# Patient Record
Sex: Male | Born: 1955 | Race: White | Hispanic: No | Marital: Married | Smoking: Former smoker
Health system: Southern US, Community
[De-identification: ages and names within clinical notes are randomized; demographics above are authoritative.]

## PROBLEM LIST (undated history)

## (undated) DIAGNOSIS — B9562 Methicillin resistant Staphylococcus aureus infection as the cause of diseases classified elsewhere: Secondary | ICD-10-CM

## (undated) DIAGNOSIS — Z9289 Personal history of other medical treatment: Secondary | ICD-10-CM

## (undated) DIAGNOSIS — I10 Essential (primary) hypertension: Secondary | ICD-10-CM

## (undated) DIAGNOSIS — N529 Male erectile dysfunction, unspecified: Secondary | ICD-10-CM

## (undated) DIAGNOSIS — G4730 Sleep apnea, unspecified: Secondary | ICD-10-CM

## (undated) DIAGNOSIS — M542 Cervicalgia: Secondary | ICD-10-CM

## (undated) DIAGNOSIS — E7800 Pure hypercholesterolemia, unspecified: Secondary | ICD-10-CM

## (undated) DIAGNOSIS — R7881 Bacteremia: Secondary | ICD-10-CM

## (undated) DIAGNOSIS — M1990 Unspecified osteoarthritis, unspecified site: Secondary | ICD-10-CM

## (undated) HISTORY — PX: WISDOM TOOTH EXTRACTION: SHX21

## (undated) HISTORY — DX: Pure hypercholesterolemia, unspecified: E78.00

## (undated) HISTORY — DX: Unspecified osteoarthritis, unspecified site: M19.90

## (undated) HISTORY — DX: Cervicalgia: M54.2

## (undated) HISTORY — DX: Male erectile dysfunction, unspecified: N52.9

## (undated) HISTORY — DX: Sleep apnea, unspecified: G47.30

## (undated) HISTORY — DX: Methicillin resistant Staphylococcus aureus infection as the cause of diseases classified elsewhere: B95.62

## (undated) HISTORY — DX: Bacteremia: R78.81

## (undated) HISTORY — DX: Personal history of other medical treatment: Z92.89

## (undated) HISTORY — PX: TONSILLECTOMY: SUR1361

## (undated) HISTORY — DX: Essential (primary) hypertension: I10

---
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
## 1998-09-06 ENCOUNTER — Encounter: Payer: Self-pay | Admitting: Family Medicine

## 1998-09-06 LAB — HM SIGMOIDOSCOPY

## 2004-07-11 ENCOUNTER — Ambulatory Visit: Payer: Self-pay | Admitting: Family Medicine

## 2004-12-26 ENCOUNTER — Ambulatory Visit: Payer: Self-pay | Admitting: Family Medicine

## 2005-01-02 ENCOUNTER — Ambulatory Visit: Payer: Self-pay | Admitting: Family Medicine

## 2005-01-19 ENCOUNTER — Ambulatory Visit: Payer: Self-pay | Admitting: Family Medicine

## 2005-04-17 ENCOUNTER — Encounter: Payer: Self-pay | Admitting: Pulmonary Disease

## 2005-04-17 ENCOUNTER — Encounter: Payer: Self-pay | Admitting: Family Medicine

## 2005-04-17 DIAGNOSIS — Z9289 Personal history of other medical treatment: Secondary | ICD-10-CM

## 2005-04-17 DIAGNOSIS — G4730 Sleep apnea, unspecified: Secondary | ICD-10-CM

## 2005-04-17 HISTORY — DX: Sleep apnea, unspecified: G47.30

## 2005-04-17 HISTORY — DX: Personal history of other medical treatment: Z92.89

## 2005-04-17 LAB — HM COLONOSCOPY

## 2005-05-22 ENCOUNTER — Ambulatory Visit: Payer: Self-pay | Admitting: Family Medicine

## 2005-06-30 ENCOUNTER — Ambulatory Visit: Payer: Self-pay | Admitting: Family Medicine

## 2006-06-23 ENCOUNTER — Ambulatory Visit: Payer: Self-pay | Admitting: Family Medicine

## 2006-11-15 ENCOUNTER — Ambulatory Visit: Payer: Self-pay | Admitting: Family Medicine

## 2006-12-30 DIAGNOSIS — G4733 Obstructive sleep apnea (adult) (pediatric): Secondary | ICD-10-CM | POA: Insufficient documentation

## 2007-01-11 ENCOUNTER — Ambulatory Visit: Payer: Self-pay | Admitting: Family Medicine

## 2007-01-12 ENCOUNTER — Encounter: Payer: Self-pay | Admitting: Family Medicine

## 2007-01-13 ENCOUNTER — Ambulatory Visit: Payer: Self-pay | Admitting: Family Medicine

## 2007-02-15 ENCOUNTER — Telehealth (INDEPENDENT_AMBULATORY_CARE_PROVIDER_SITE_OTHER): Payer: Self-pay | Admitting: *Deleted

## 2007-03-25 ENCOUNTER — Ambulatory Visit: Payer: Self-pay | Admitting: Family Medicine

## 2007-05-19 ENCOUNTER — Ambulatory Visit: Payer: Self-pay | Admitting: Family Medicine

## 2007-06-28 ENCOUNTER — Ambulatory Visit: Payer: Self-pay | Admitting: Family Medicine

## 2007-09-01 ENCOUNTER — Ambulatory Visit: Payer: Self-pay | Admitting: Family Medicine

## 2007-09-02 ENCOUNTER — Telehealth: Payer: Self-pay | Admitting: Family Medicine

## 2007-10-05 ENCOUNTER — Ambulatory Visit: Payer: Self-pay | Admitting: Family Medicine

## 2007-10-05 DIAGNOSIS — M62838 Other muscle spasm: Secondary | ICD-10-CM | POA: Insufficient documentation

## 2007-12-07 ENCOUNTER — Ambulatory Visit: Payer: Self-pay | Admitting: Family Medicine

## 2007-12-07 LAB — CONVERTED CEMR LAB
ALT: 23 units/L (ref 0–53)
AST: 21 units/L (ref 0–37)
Albumin: 3.8 g/dL (ref 3.5–5.2)
Alkaline Phosphatase: 69 units/L (ref 39–117)
BUN: 16 mg/dL (ref 6–23)
Basophils Absolute: 0.1 10*3/uL (ref 0.0–0.1)
Basophils Relative: 0.8 % (ref 0.0–3.0)
Bilirubin, Direct: 0.1 mg/dL (ref 0.0–0.3)
CO2: 30 meq/L (ref 19–32)
Calcium: 9.2 mg/dL (ref 8.4–10.5)
Chloride: 103 meq/L (ref 96–112)
Cholesterol: 196 mg/dL (ref 0–200)
Creatinine, Ser: 1.1 mg/dL (ref 0.4–1.5)
Direct LDL: 121.6 mg/dL
Eosinophils Absolute: 0.3 10*3/uL (ref 0.0–0.7)
Eosinophils Relative: 4.1 % (ref 0.0–5.0)
GFR calc Af Amer: 91 mL/min
GFR calc non Af Amer: 75 mL/min
Glucose, Bld: 92 mg/dL (ref 70–99)
HCT: 41.3 % (ref 39.0–52.0)
HDL: 29.9 mg/dL — ABNORMAL LOW (ref >39.0)
Hemoglobin: 14.6 g/dL (ref 13.0–17.0)
Lymphocytes Relative: 26.7 % (ref 12.0–46.0)
MCHC: 35.3 g/dL (ref 30.0–36.0)
MCV: 87.4 fL (ref 78.0–100.0)
Monocytes Absolute: 0.8 10*3/uL (ref 0.1–1.0)
Monocytes Relative: 11.9 % (ref 3.0–12.0)
Neutro Abs: 3.6 10*3/uL (ref 1.4–7.7)
Neutrophils Relative %: 56.5 % (ref 43.0–77.0)
PSA: 0.92 ng/mL (ref 0.10–4.00)
Platelets: 244 10*3/uL (ref 150–400)
Potassium: 4.5 meq/L (ref 3.5–5.1)
RBC: 4.73 M/uL (ref 4.22–5.81)
RDW: 12.6 % (ref 11.5–14.6)
Sodium: 139 meq/L (ref 135–145)
TSH: 1.32 microintl units/mL (ref 0.35–5.50)
Total Bilirubin: 0.6 mg/dL (ref 0.3–1.2)
Total CHOL/HDL Ratio: 6.6
Total Protein: 6.5 g/dL (ref 6.0–8.3)
Triglycerides: 233 mg/dL (ref 0–149)
VLDL: 47 mg/dL — ABNORMAL HIGH (ref 0–40)
WBC: 6.5 10*3/uL (ref 4.5–10.5)

## 2007-12-08 ENCOUNTER — Encounter (INDEPENDENT_AMBULATORY_CARE_PROVIDER_SITE_OTHER): Payer: Self-pay | Admitting: *Deleted

## 2007-12-22 ENCOUNTER — Encounter (INDEPENDENT_AMBULATORY_CARE_PROVIDER_SITE_OTHER): Payer: Self-pay | Admitting: *Deleted

## 2007-12-22 LAB — CONVERTED CEMR LAB
BUN: 13 mg/dL
Cholesterol: 259 mg/dL
Creatinine, Ser: 1 mg/dL
Glucose, Bld: 88 mg/dL
HDL: 47 mg/dL
LDL Cholesterol: 172 mg/dL
PSA: 2.11 ng/mL
Potassium: 5.5 meq/L
Triglycerides: 183 mg/dL

## 2008-02-29 ENCOUNTER — Ambulatory Visit: Payer: Self-pay | Admitting: Family Medicine

## 2008-02-29 DIAGNOSIS — D485 Neoplasm of uncertain behavior of skin: Secondary | ICD-10-CM | POA: Insufficient documentation

## 2008-03-22 ENCOUNTER — Telehealth: Payer: Self-pay | Admitting: Family Medicine

## 2008-05-01 ENCOUNTER — Ambulatory Visit: Payer: Self-pay | Admitting: Family Medicine

## 2008-05-18 ENCOUNTER — Ambulatory Visit: Payer: Self-pay | Admitting: Pulmonary Disease

## 2008-06-01 ENCOUNTER — Encounter (INDEPENDENT_AMBULATORY_CARE_PROVIDER_SITE_OTHER): Payer: Self-pay | Admitting: *Deleted

## 2008-06-01 ENCOUNTER — Ambulatory Visit: Payer: Self-pay | Admitting: Family Medicine

## 2008-06-01 LAB — CONVERTED CEMR LAB
OCCULT 1: NEGATIVE
OCCULT 2: NEGATIVE
OCCULT 3: NEGATIVE

## 2008-06-28 ENCOUNTER — Telehealth: Payer: Self-pay | Admitting: Family Medicine

## 2008-09-14 ENCOUNTER — Telehealth (INDEPENDENT_AMBULATORY_CARE_PROVIDER_SITE_OTHER): Payer: Self-pay | Admitting: Internal Medicine

## 2008-09-14 ENCOUNTER — Ambulatory Visit: Payer: Self-pay | Admitting: Family Medicine

## 2008-09-17 ENCOUNTER — Telehealth (INDEPENDENT_AMBULATORY_CARE_PROVIDER_SITE_OTHER): Payer: Self-pay | Admitting: *Deleted

## 2008-09-18 ENCOUNTER — Telehealth: Payer: Self-pay | Admitting: Family Medicine

## 2008-10-19 ENCOUNTER — Telehealth: Payer: Self-pay | Admitting: Family Medicine

## 2008-11-09 ENCOUNTER — Telehealth: Payer: Self-pay | Admitting: Family Medicine

## 2008-12-18 ENCOUNTER — Telehealth: Payer: Self-pay | Admitting: Family Medicine

## 2008-12-28 ENCOUNTER — Ambulatory Visit: Payer: Self-pay | Admitting: Family Medicine

## 2009-01-07 ENCOUNTER — Ambulatory Visit: Payer: Self-pay | Admitting: Family Medicine

## 2009-01-07 DIAGNOSIS — E669 Obesity, unspecified: Secondary | ICD-10-CM | POA: Insufficient documentation

## 2009-01-07 DIAGNOSIS — I10 Essential (primary) hypertension: Secondary | ICD-10-CM | POA: Insufficient documentation

## 2009-02-21 ENCOUNTER — Ambulatory Visit: Payer: Self-pay | Admitting: Family Medicine

## 2009-02-27 ENCOUNTER — Telehealth: Payer: Self-pay | Admitting: Family Medicine

## 2009-03-08 ENCOUNTER — Telehealth: Payer: Self-pay | Admitting: Family Medicine

## 2009-03-25 ENCOUNTER — Ambulatory Visit: Payer: Self-pay | Admitting: Family Medicine

## 2009-03-25 DIAGNOSIS — E559 Vitamin D deficiency, unspecified: Secondary | ICD-10-CM | POA: Insufficient documentation

## 2009-03-26 LAB — CONVERTED CEMR LAB: Vit D, 25-Hydroxy: 21 ng/mL — ABNORMAL LOW (ref 30–89)

## 2009-04-01 ENCOUNTER — Ambulatory Visit: Payer: Self-pay | Admitting: Family Medicine

## 2009-04-12 ENCOUNTER — Telehealth (INDEPENDENT_AMBULATORY_CARE_PROVIDER_SITE_OTHER): Payer: Self-pay | Admitting: Internal Medicine

## 2009-05-23 ENCOUNTER — Telehealth: Payer: Self-pay | Admitting: Family Medicine

## 2009-06-27 ENCOUNTER — Ambulatory Visit: Payer: Self-pay | Admitting: Family Medicine

## 2009-06-27 LAB — CONVERTED CEMR LAB
ALT: 28 units/L (ref 0–53)
AST: 23 units/L (ref 0–37)
Albumin: 4.1 g/dL (ref 3.5–5.2)
Alkaline Phosphatase: 72 units/L (ref 39–117)
BUN: 17 mg/dL (ref 6–23)
Basophils Absolute: 0 10*3/uL (ref 0.0–0.1)
Basophils Relative: 0 % (ref 0.0–3.0)
Bilirubin, Direct: 0.1 mg/dL (ref 0.0–0.3)
CO2: 32 meq/L (ref 19–32)
Calcium: 9.2 mg/dL (ref 8.4–10.5)
Chloride: 106 meq/L (ref 96–112)
Cholesterol: 185 mg/dL (ref 0–200)
Creatinine, Ser: 1 mg/dL (ref 0.4–1.5)
Eosinophils Absolute: 0.2 10*3/uL (ref 0.0–0.7)
Eosinophils Relative: 3.6 % (ref 0.0–5.0)
GFR calc non Af Amer: 83.31 mL/min (ref >60)
Glucose, Bld: 88 mg/dL (ref 70–99)
HCT: 42.6 % (ref 39.0–52.0)
HDL: 36.4 mg/dL — ABNORMAL LOW (ref >39.00)
Hemoglobin: 14 g/dL (ref 13.0–17.0)
LDL Cholesterol: 109 mg/dL — ABNORMAL HIGH (ref 0–99)
Lymphocytes Relative: 25.9 % (ref 12.0–46.0)
Lymphs Abs: 1.5 10*3/uL (ref 0.7–4.0)
MCHC: 32.9 g/dL (ref 30.0–36.0)
MCV: 87.6 fL (ref 78.0–100.0)
Monocytes Absolute: 0.6 10*3/uL (ref 0.1–1.0)
Monocytes Relative: 10.5 % (ref 3.0–12.0)
Neutro Abs: 3.6 10*3/uL (ref 1.4–7.7)
Neutrophils Relative %: 60 % (ref 43.0–77.0)
PSA: 0.77 ng/mL (ref 0.10–4.00)
Platelets: 198 10*3/uL (ref 150.0–400.0)
Potassium: 4.7 meq/L (ref 3.5–5.1)
RBC: 4.86 M/uL (ref 4.22–5.81)
RDW: 12.7 % (ref 11.5–14.6)
Sodium: 142 meq/L (ref 135–145)
TSH: 1.41 microintl units/mL (ref 0.35–5.50)
Total Bilirubin: 0.4 mg/dL (ref 0.3–1.2)
Total CHOL/HDL Ratio: 5
Total Protein: 7.1 g/dL (ref 6.0–8.3)
Triglycerides: 196 mg/dL — ABNORMAL HIGH (ref 0.0–149.0)
VLDL: 39.2 mg/dL (ref 0.0–40.0)
WBC: 5.9 10*3/uL (ref 4.5–10.5)

## 2009-06-30 LAB — CONVERTED CEMR LAB: Vit D, 25-Hydroxy: 34 ng/mL (ref 30–89)

## 2009-07-02 ENCOUNTER — Ambulatory Visit: Payer: Self-pay | Admitting: Family Medicine

## 2009-07-05 ENCOUNTER — Telehealth: Payer: Self-pay | Admitting: Family Medicine

## 2009-07-12 ENCOUNTER — Ambulatory Visit: Payer: Self-pay | Admitting: Family Medicine

## 2009-07-12 LAB — FECAL OCCULT BLOOD, GUAIAC: Fecal Occult Blood: NEGATIVE

## 2009-07-12 LAB — CONVERTED CEMR LAB
OCCULT 1: NEGATIVE
OCCULT 2: NEGATIVE
OCCULT 3: NEGATIVE

## 2009-07-15 ENCOUNTER — Encounter (INDEPENDENT_AMBULATORY_CARE_PROVIDER_SITE_OTHER): Payer: Self-pay | Admitting: *Deleted

## 2009-08-15 ENCOUNTER — Telehealth: Payer: Self-pay | Admitting: Family Medicine

## 2009-09-12 ENCOUNTER — Ambulatory Visit: Payer: Self-pay | Admitting: Family Medicine

## 2009-09-18 ENCOUNTER — Telehealth: Payer: Self-pay | Admitting: Family Medicine

## 2009-10-01 ENCOUNTER — Ambulatory Visit: Payer: Self-pay | Admitting: Family Medicine

## 2009-12-13 ENCOUNTER — Encounter (INDEPENDENT_AMBULATORY_CARE_PROVIDER_SITE_OTHER): Payer: Self-pay | Admitting: *Deleted

## 2009-12-17 ENCOUNTER — Encounter (INDEPENDENT_AMBULATORY_CARE_PROVIDER_SITE_OTHER): Payer: Self-pay | Admitting: *Deleted

## 2010-01-27 ENCOUNTER — Telehealth: Payer: Self-pay | Admitting: Family Medicine

## 2010-02-10 ENCOUNTER — Telehealth: Payer: Self-pay | Admitting: Family Medicine

## 2010-02-10 ENCOUNTER — Telehealth (INDEPENDENT_AMBULATORY_CARE_PROVIDER_SITE_OTHER): Payer: Self-pay | Admitting: *Deleted

## 2010-02-13 ENCOUNTER — Ambulatory Visit: Payer: Self-pay | Admitting: Pulmonary Disease

## 2010-02-19 ENCOUNTER — Ambulatory Visit: Payer: Self-pay | Admitting: Family Medicine

## 2010-03-25 ENCOUNTER — Telehealth: Payer: Self-pay | Admitting: Family Medicine

## 2010-04-01 ENCOUNTER — Ambulatory Visit: Payer: Self-pay | Admitting: Family Medicine

## 2010-04-02 ENCOUNTER — Telehealth: Payer: Self-pay | Admitting: Family Medicine

## 2010-05-07 ENCOUNTER — Telehealth: Payer: Self-pay | Admitting: Family Medicine

## 2010-06-10 NOTE — Assessment & Plan Note (Signed)
Summary: 4:15 schaller flu shot/rbh R/S FROM 10/11  Nurse Visit   Allergies: 1)  ! * Codeine  Orders Added: 1)  Admin 1st Vaccine [90471] 2)  Flu Vaccine 52yrs + [41324]  Flu Vaccine Consent Questions     Do you have a history of severe allergic reactions to this vaccine? no    Any prior history of allergic reactions to egg and/or gelatin? no    Do you have a sensitivity to the preservative Thimersol? no    Do you have a past history of Guillan-Barre Syndrome? no    Do you currently have an acute febrile illness? no    Have you ever had a severe reaction to latex? no    Vaccine information given and explained to patient? yes    Are you currently pregnant? no    Lot Number:[id number]   Exp Date:11/08/2010   Site Given  Left Deltoid IM

## 2010-06-10 NOTE — Progress Notes (Signed)
Summary: refil vicodine  Phone Note Refill Request Message from:  [facility]line on Sep 17, 2008 12:32 PM  Not on med list. In Dr. Lorenza Chick absence. Vicodin 5/500mg  take one tablet by mouth every 6 hours as needed [address] [facility] [address] HY#[phone].  Initial call taken by: Silas Sacramento CMA,  Sep 17, 2008 12:34 PM  Follow-up for Phone Call        see  phone message of Friday  Billie-Lynn Tyler Deis FNP  Sep 17, 2008 2:12 PM   Additional Follow-up for Phone Call Additional follow up Details #1::        Patient is almost out of Hycodan and would like the Vicodin 5-500mg  tabs called in to [facility] [address] He is feeling some better but is still coughing. Additional Follow-up by: Lewanda Rife,  Sep 17, 2008 4:49 PM    Additional Follow-up for Phone Call Additional follow up Details #2::    will refill caps, Rx attatched Billie-Lynn Tyler Deis FNP  Sep 17, 2008 5:01 PM n  Additional Follow-up for Phone Call Additional follow up Details #3:: Details for Additional Follow-up Action Taken: Medication phoned to pharmacy.  Additional Follow-up by: Lewanda Rife,  Sep 17, 2008 5:10 PM  New/Updated Medications: VICODIN 5-500 MG TABS (HYDROCODONE-ACETAMINOPHEN) 1 every 6 hrs as needed pain   Prescriptions: VICODIN 5-500 MG TABS (HYDROCODONE-ACETAMINOPHEN) 1 every 6 hrs as needed pain  #40 x 0   Entered and Authorized by:   Gildardo Griffes FNP   Signed by:   Lewanda Rife on 09/17/2008   Method used:   Telephoned to ...       [facility] [address] (retail)       [address]       [place], [place]  [address]       Ph: [phone]       Fax: [phone]   RxID:   [id number]

## 2010-06-10 NOTE — Assessment & Plan Note (Signed)
Summary: 2  1/2 MONTH FOLLOW UP/RBH   Vital Signs:  Patient profile:   55 year old male Height:      69.25 inches Weight:      287.25 pounds BMI:     42.27 Temp:     98.5 degrees F oral Pulse rate:   80 / minute Pulse rhythm:   regular BP sitting:   138 / 80  (left arm) Cuff size:   large  Vitals Entered By: Lewanda Rife LPN (April 01, 2009 3:35 PM)  History of Present Illness: Pt here for recheck of Bp and Vit D. He is heavier appearing today. he c/o knees bothering him. He has missed as many Vit D pills as he has taken for replacement.  Problems Prior to Update: 1)  Unspecified Vitamin D Deficiency  (ICD-268.9) 2)  Hypertension  (ICD-401.9) 3)  Obesity  (ICD-278.00) 4)  Cellulitis, Leg, Left  (ICD-682.6) 5)  Tick Bite  (ICD-E906.4) 6)  Special Screening Malig Neoplasms Other Sites  (ICD-V76.49) 7)  Agitation  (ICD-307.9) 8)  Neoplasm of Uncertain Behavior of Skin  (ICD-238.2) 9)  Neck Spasm  (ICD-847.0) 10)  Health Maintenance Exam  (ICD-V70.0) 11)  Screening For Malignant Neoplasm, Prostate  (ICD-V76.44) 12)  Sleep Apnea, Obstructive, Mild  (ICD-327.23) 13)  Erectile Dysfunction  (ICD-302.72) 14)  Hypercholesterolemia, Mild 202/trig 217  (ICD-272.0) 15)  Nicotine Addiction  (ICD-305.1) 16)  Knee Pain, Right  (ICD-719.46)  Medications Prior to Update: 1)  Soma 350 Mg  Tabs (Carisoprodol) .Marland Kitchen.. 1 By Mouth Four Times A Day As Needed 2)  Viagra   Tabs (Sildenafil Citrate Tabs) .... Prn 3)  Ibuprofen 800 Mg  Tabs (Ibuprofen) .Marland Kitchen.. 1 Tablet Q 6 Hours As Needed Pain 4)  Hycodan .Marland Kitchen.. 1 Tsp At Bedtime As Needed Cough, May Repeat in 4-6 H As Needed 5)  Vicodin 5-500 Mg Tabs (Hydrocodone-Acetaminophen) .Marland Kitchen.. 1 Every 6 Hrs As Needed Pain 6)  Doxycycline Hyclate 100 Mg Caps (Doxycycline Hyclate) .... One Tab By Mouth Two Times A Day 7)  Metoprolol Succinate 25 Mg Xr24h-Tab (Metoprolol Succinate) .... One Tab By Mouth At Night.  Allergies: 1)  ! * Codeine  Physical  Exam  General:  alert, well-developed, well-nourished, and well-hydrated.  Obese. Waist 48...Marland Kitchenwears suspenders. Head:  Normocephalic and atraumatic without obvious abnormalities. No apparent alopecia or balding. Sinuses nontender. Eyes:  PERRLA and EOMI.   Ears:  TMs retracted with fluid bilat Nose:  no airflow obstruction, mucosal erythema, and mucosal edema.  sinuses neg Mouth:  no exudates and pharyngeal erythema.     Impression & Recommendations:  Problem # 1:  UNSPECIFIED VITAMIN D DEFICIENCY (ICD-268.9) Assessment Unchanged No change...start Vit D two times a day and try to take them. Recheck 3 months.  Problem # 2:  HYPERTENSION (ICD-401.9) Assessment: Improved Cont Metoprolol and try to get one a day. His updated medication list for this problem includes:    Metoprolol Succinate 25 Mg Xr24h-tab (Metoprolol succinate) ..... One tab by mouth at night.  BP today: 138/80 Prior BP: 140/100 (01/07/2009)  Labs Reviewed: K+: 4.5 (12/07/2007) Creat: : 1.1 (12/07/2007)   Chol: 196 (12/07/2007)   HDL: 29.9 (12/07/2007)   LDL: DEL (12/07/2007)   TG: 233 (12/07/2007)  Problem # 3:  OBESITY (ICD-278.00) Assessment: Deteriorated  Gained 8 pounds in 21/2 mos! Really needs to lose. Get to 38 inch trousers. Discussed motivation and ways to effect this change.  Ht: 69.25 (04/01/2009)   Wt: 287.25 (04/01/2009)   BMI: 42.27 (04/01/2009)  Complete Medication List: 1)  Soma 350 Mg Tabs (Carisoprodol) .Marland Kitchen.. 1 by mouth four times a day as needed 2)  Viagra Tabs (Sildenafil citrate tabs) .... Prn 3)  Ibuprofen 800 Mg Tabs (Ibuprofen) .Marland Kitchen.. 1 tablet q 6 hours as needed pain 4)  Hycodan  .Marland Kitchen.. 1 tsp at bedtime as needed cough, may repeat in 4-6 h as needed 5)  Vicodin 5-500 Mg Tabs (Hydrocodone-acetaminophen) .Marland Kitchen.. 1 every 6 hrs as needed pain 6)  Metoprolol Succinate 25 Mg Xr24h-tab (Metoprolol succinate) .... One tab by mouth at night. 7)  Vitamin D 1000 Iu  .... One daily  Patient  Instructions: 1)  RTC 3 mos. 2)  Lose weight, cont BP meds, start Vit D  two times a day and recheck when seen.  Current Allergies (reviewed today): ! * CODEINE

## 2010-06-10 NOTE — Progress Notes (Signed)
Summary: refill vicodan for Dr. Hetty Ely  Phone Note Refill Request Message from:  [facility] on November 09, 2008 9:40 AM  Refills Requested: Medication #1:  VICODIN 5-500 MG TABS 1 every 6 hrs as needed pain.   Last Refilled: 09/25/2008 [phone]  Initial call taken by: Providence Crosby,  November 09, 2008 9:41 AM  Follow-up for Phone Call        can refil times one mo Follow-up by: Judith Part MD,  November 09, 2008 10:02 AM  Additional Follow-up for Phone Call Additional follow up Details #1::        prescription called to pharmacy line Additional Follow-up by: Providence Crosby,  November 09, 2008 10:07 AM

## 2010-06-10 NOTE — Assessment & Plan Note (Signed)
Summary: cpx   Vital Signs:  Patient Profile:   55 Years Old Male Height:     69.50 inches Weight:      279 pounds Temp:     97.9 degrees F oral Pulse rate:   88 / minute Pulse rhythm:   regular BP sitting:   140 / 80  (left arm) Cuff size:   large  Vitals Entered By: Providence Crosby (May 01, 2008 2:02 PM)                 Chief Complaint:  CHECK UP// HEMOCCULTCARDS TO PATIENT.  History of Present Illness: Pt here for Comp Exam with some right knee pain lately. Has had sleep study and needs CPAP machine. Is snoring a lot.    Prior Medications Reviewed Using: Patient Recall  Current Allergies (reviewed today): ! * CODEINE   Family History:    Father: A 4   CAD Stent Skin Ca    Mother: A 81  HTN Thyroid dz    BrotherA 58 HTN    Brother A 54    CV - none    HBP + Bro, mother    Cancer - none    Depression - none    Strokes - none    ETOH/Drug abuse - none    GM has Parkinson's disease    Uncle has diabetes   Risk Factors:  Tobacco use:  quit    Year quit:  2009    Pack-years:  33    Counseled to quit/cut down alcohol use:  no   Review of Systems  General      Complains of fatigue.      Denies chills, fever, loss of appetite, malaise, sleep disorder, sweats, weakness, and weight loss.  Eyes      Denies blurring, discharge, double vision, eye irritation, eye pain, halos, itching, light sensitivity, red eye, vision loss-1 eye, and vision loss-both eyes.  ENT      Denies decreased hearing, difficulty swallowing, ear discharge, earache, hoarseness, nasal congestion, nosebleeds, postnasal drainage, ringing in ears, sinus pressure, and sore throat.  CV      Denies bluish discoloration of lips or nails, chest pain or discomfort, difficulty breathing at night, difficulty breathing while lying down, fainting, fatigue, leg cramps with exertion, lightheadness, near fainting, palpitations, shortness of breath with exertion, swelling of feet, swelling of hands,  and weight gain.      occas chest/shoulder pain.  Resp      Denies chest discomfort, chest pain with inspiration, cough, coughing up blood, excessive snoring, hypersomnolence, morning headaches, pleuritic, shortness of breath, sputum productive, and wheezing.  GI      Complains of indigestion.      Denies abdominal pain, bloody stools, change in bowel habits, constipation, dark tarry stools, diarrhea, excessive appetite, gas, hemorrhoids, loss of appetite, nausea, vomiting, vomiting blood, and yellowish skin color.  GU      Complains of nocturia.      Denies decreased libido, discharge, dysuria, erectile dysfunction, genital sores, hematuria, incontinence, urinary frequency, and urinary hesitancy.      one- two  MS      Complains of joint pain.      right knee and right knee  Derm      Denies changes in color of skin, changes in nail beds, dryness, excessive perspiration, flushing, hair loss, insect bite(s), itching, lesion(s), poor wound healing, and rash.  Neuro      Denies brief paralysis, difficulty with concentration, disturbances  in coordination, falling down, headaches, inability to speak, memory loss, numbness, poor balance, seizures, sensation of room spinning, tingling, tremors, visual disturbances, and weakness.   Physical Exam  General:     Well-developed,well-nourished,in no acute distress; alert,appropriate and cooperative throughout examination, mildly obese. Head:     Normocephalic and atraumatic without obvious abnormalities. No apparent alopecia or balding. Sinuses nontender. Eyes:     Conjunctiva clear bilaterally.  Ears:     External ear exam shows no significant lesions or deformities.  Otoscopic examination reveals clear canals, tympanic membranes are intact bilaterally without bulging, retraction, inflammation or discharge. Hearing is grossly normal bilaterally. Nose:     External nasal examination shows no deformity or inflammation. Nasal mucosa are pink  and moist without lesions or exudates. Mouth:     Oral mucosa and oropharynx without lesions or exudates.  Teeth in good repair. Neck:     No deformities, masses, or tenderness noted. Chest Wall:     No deformities, masses, tenderness or gynecomastia noted. Breasts:     No masses or gynecomastia noted Lungs:     Normal respiratory effort, chest expands symmetrically. Lungs are clear to auscultation, no crackles or wheezes. Heart:     Normal rate and regular rhythm. S1 and S2 normal without gallop, murmur, click, rub or other extra sounds. Abdomen:     Bowel sounds positive,abdomen soft and non-tender without masses, organomegaly or hernias noted. Rectal:     Declines. Genitalia:     Could not get him too even do this, he declined. Prostate:     Declines. Msk:     No deformity or scoliosis noted of thoracic or lumbar spine.   Pulses:     R and L carotid,radial,femoral,dorsalis pedis and posterior tibial pulses are full and equal bilaterally Extremities:     ERight shoulder symmetric but mildly decr ROM. R knee mildly tender along the joint line. Neurologic:     No cranial nerve deficits noted. Station and gait are normal. Plantar reflexes are down-going bilaterally. DTRs are symmetrical throughout. Sensory, motor and coordinative functions appear intact. Skin:     Intact without suspicious lesions or rashes Cervical Nodes:     No lymphadenopathy noted Inguinal Nodes:     No significant adenopathy Psych:     Cognition and judgment appear intact. Alert and cooperative with normal attention span and concentration. No apparent delusions, illusions, hallucinations    Impression & Recommendations:  Problem # 1:  HEALTH MAINTENANCE EXAM (ICD-V70.0) Assessment: Comment Only  Problem # 2:  SCREENING FOR MALIGNANT NEOPLASM, PROSTATE (ICD-V76.44) Assessment: Unchanged PSA ok, exam vehemently declined.  Problem # 3:  SLEEP APNEA, OBSTRUCTIVE, MILD (ICD-327.23) Assessment:  Unchanged Will check on study and see if can qualify for CPAP machine.  Problem # 4:  ERECTILE DYSFUNCTION (ICD-302.72) Assessment: Unchanged Adequate with medication. His updated medication list for this problem includes:    Viagra Tabs (Sildenafil citrate tabs) .Marland Kitchen... Prn   Problem # 5:  HYPERCHOLESTEROLEMIA, MILD 202/TRIG 217 (ICD-272.0) Assessment: Unchanged Trig too high, needs to cut back on sweets and carbs for many reasons. Labs Reviewed: Chol: 196 (12/07/2007)   HDL: 29.9 (12/07/2007)   LDL: 121.6 (12/07/2007)   TG: 233 (12/07/2007) SGOT: 21 (12/07/2007)   SGPT: 23 (12/07/2007)   Problem # 6:  NICOTINE ADDICTION (ICD-305.1) Assessment: Improved Has stopped. Congratuklations.  Problem # 7:  KNEE PAIN, RIGHT (ICD-719.46) Assessment: Unchanged Stable. Requests Vicodin for pain at night to sleep. His updated medication list for this problem includes:  Soma 350 Mg Tabs (Carisoprodol) .Marland Kitchen... 1 by mouth four times a day as needed    Ibuprofen 800 Mg Tabs (Ibuprofen) .Marland Kitchen... 1 tablet q 6 hours as needed pain    Vicodin 5-500 Mg Tabs (Hydrocodone-acetaminophen) .Marland Kitchen... 1 q6hours as needed pain Discussed strengthening exercises, use of ice or heat, and medications.   Complete Medication List: 1)  Soma 350 Mg Tabs (Carisoprodol) .Marland Kitchen.. 1 by mouth four times a day as needed 2)  Viagra Tabs (Sildenafil citrate tabs) .... Prn 3)  Ibuprofen 800 Mg Tabs (Ibuprofen) .Marland Kitchen.. 1 tablet q 6 hours as needed pain 4)  Hycodan 5-1.5 Mg/33ml Syrp (Hydrocodone-homatropine) .... One tsppo at night 5)  Vicodin 5-500 Mg Tabs (Hydrocodone-acetaminophen) .Marland Kitchen.. 1 q6hours as needed pain   Patient Instructions: 1)  RTC as needed, one year o/w. 2)  Will get back to him on Sleep Apnea disposition.   Prescriptions: VICODIN 5-500 MG TABS (HYDROCODONE-ACETAMINOPHEN) 1 Q6HOURS as needed PAIN  #50 x 0   Entered and Authorized by:   Shaune Leeks MD   Signed by:   Shaune Leeks MD on 05/01/2008    Method used:   Print then Give to Patient   RxID:   [id number]  ]  Appended Document: cpx Pt's sleep study is 55 years old. I think it is worth getting him seen by Pulm and reevaluated. They will start him on therapy after seen. Please scehdule with Sleep Medicine.   Clinical Lists Changes  Orders: Added new Referral order of Sleep Disorder Referral (Sleep Disorder) - Signed

## 2010-06-10 NOTE — Progress Notes (Signed)
Summary: REFILL SOMA AND IBUPROFEN  Phone Note Refill Request Message from:  [facility] on March 22, 2008 10:42 AM  Refills Requested: Medication #1:  SOMA 350 MG  TABS 1 by mouth four times a day as needed   Last Refilled: 01/28/2008  Medication #2:  IBUPROFEN 800 MG  TABS 1 tablet q 6 hours as needed pain   Last Refilled: 01/28/2008 [facility] [address] [phone]  Initial call taken by: Providence Crosby,  March 22, 2008 10:43 AM      Prescriptions: IBUPROFEN 800 MG  TABS (IBUPROFEN) 1 tablet q 6 hours as needed pain  #60 Each x 2   Entered and Authorized by:   Shaune Leeks MD   Signed by:   Shaune Leeks MD on 03/22/2008   Method used:   Electronically to        [facility] [address] (retail)       [address]       [place], [place]  [address]       Ph: [phone]       Fax: [phone]   RxID:   [id number] SOMA 350 MG  TABS (CARISOPRODOL) 1 by mouth four times a day as needed  #120 x 2   Entered and Authorized by:   Shaune Leeks MD   Signed by:   Shaune Leeks MD on 03/22/2008   Method used:   Electronically to        [facility] [address] (retail)       [address], [address]       [place], [place]  [address]       Ph: [phone]       Fax: [phone]   RxID:   [id number]  Shaune Leeks MD  March 22, 2008 12:59 PM

## 2010-06-10 NOTE — Progress Notes (Signed)
Summary: no longer taking metoprolol  Phone Note Call from Patient   Caller: Patient Call For: Shaune Leeks MD Summary of Call: Pt was seen yesterday, was told to call back with the name of the blood pressure medicine that he is no longer taking.  He's no longer taking metoprolol. Initial call taken by: Lowella Petties CMA, AAMA,  April 02, 2010 4:09 PM  Follow-up for Phone Call        Noted. Follow-up by: Shaune Leeks MD,  April 02, 2010 4:54 PM

## 2010-06-10 NOTE — Assessment & Plan Note (Signed)
Summary: CPX/DLO   Vital Signs:  Patient profile:   55 year old male Weight:      285.75 pounds Temp:     98.8 degrees F oral Pulse rate:   92 / minute Pulse rhythm:   regular BP sitting:   140 / 100 Cuff size:   large  Vitals Entered By: Sydell Axon LPN (July 02, 2009 10:45 AM) CC: 30 Minute checkup, hemoccult cards given to patient   History of Present Illness: Pt here for Comp Exam. His BP is higher today...he is not taking Metoprolol because it bothers his joints, esp right toe and left shoulder.  He otherwise has knee pain he deals with.   Preventive Screening-Counseling & Management  Alcohol-Tobacco     Alcohol drinks/day: <1     Alcohol type: beer     Smoking Status: quit     Smoking Cessation Counseling: yes     Packs/Day: 1     Year Started: 1977     Year Quit: 2009     Pack years: 1 ppd     Cans of tobacco/week: no     Passive Smoke Exposure: no  Caffeine-Diet-Exercise     Caffeine use/day: 3     Does Patient Exercise: no  Problems Prior to Update: 1)  Unspecified Vitamin D Deficiency  (ICD-268.9) 2)  Hypertension  (ICD-401.9) 3)  Obesity  (ICD-278.00) 4)  Cellulitis, Leg, Left  (ICD-682.6) 5)  Tick Bite  (ICD-E906.4) 6)  Special Screening Malig Neoplasms Other Sites  (ICD-V76.49) 7)  Agitation  (ICD-307.9) 8)  Neoplasm of Uncertain Behavior of Skin  (ICD-238.2) 9)  Neck Spasm  (ICD-847.0) 10)  Health Maintenance Exam  (ICD-V70.0) 11)  Screening For Malignant Neoplasm, Prostate  (ICD-V76.44) 12)  Sleep Apnea, Obstructive, Mild  (ICD-327.23) 13)  Erectile Dysfunction  (ICD-302.72) 14)  Hypercholesterolemia, Mild 202/trig 217  (ICD-272.0) 15)  Nicotine Addiction  (ICD-305.1) 16)  Knee Pain, Right  (ICD-719.46)  Medications Prior to Update: 1)  Soma 350 Mg  Tabs (Carisoprodol) .Marland Kitchen.. 1 By Mouth Four Times A Day As Needed 2)  Viagra   Tabs (Sildenafil Citrate Tabs) .... Prn 3)  Ibuprofen 800 Mg  Tabs (Ibuprofen) .Marland Kitchen.. 1 Tablet Q 6 Hours As Needed  Pain 4)  Hycodan .Marland Kitchen.. 1 Tsp At Bedtime As Needed Cough, May Repeat in 4-6 H As Needed 5)  Vicodin 5-500 Mg Tabs (Hydrocodone-Acetaminophen) .Marland Kitchen.. 1 Every 6 Hrs As Needed Pain 6)  Metoprolol Succinate 25 Mg Xr24h-Tab (Metoprolol Succinate) .... One Tab By Mouth At Night. 7)  Vitamin D 1000 Iu .... One Daily  Allergies: 1)  ! * Codeine  Past History:  Past Medical History: Last updated: 05/18/2008  URI (ICD-465.9) HEALTH MAINTENANCE EXAM (ICD-V70.0) SCREENING FOR MALIGNANT NEOPLASM, PROSTATE (ICD-V76.44) SLEEP APNEA, OBSTRUCTIVE, MILD (ICD-327.23) ERECTILE DYSFUNCTION (ICD-302.72) HYPERCHOLESTEROLEMIA, MILD 202/TRIG 217 (ICD-272.0) NICOTINE ADDICTION (ICD-305.1) KNEE PAIN, RIGHT (ICD-719.46)    Family History: Last updated: 07/02/2009 Father: A 83   CAD Stent Skin Ca Mother: A 82  HTN Thyroid dz Stent BrotherA 59 HTN Brother A 55 CV - none HBP + Bro, mother Cancer - none Depression - none Strokes - none ETOH/Drug abuse - none GM has Parkinson's disease Uncle has diabetes  Social History: Last updated: 12/30/2006 Marital Status: Remarried as is his wife Children: One step son 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 spine without DDD 4/5, bulg 5/6, narrowing 5/6 10/30/2002 Sleep study, mild obstruc. sleep apnea 04/17/05  Family History: Father: A 83   CAD Stent Skin Ca Mother: A 82  HTN Thyroid dz Stent BrotherA 59 HTN Brother A 55 CV - none HBP + Bro, mother Cancer - none Depression - none Strokes - none ETOH/Drug abuse - none GM has Parkinson's disease Uncle has diabetes  Social History: Caffeine use/day:  3  Review of  Systems General:  Denies chills, fatigue, fever, sweats, weakness, and weight loss. Eyes:  Denies blurring, discharge, eye pain, and itching. ENT:  Denies decreased hearing, ear discharge, earache, and ringing in ears. CV:  Denies chest pain or discomfort, fainting, fatigue, palpitations, shortness of breath with exertion, swelling of feet, and swelling of hands. Resp:  Denies cough, shortness of breath, and wheezing. GI:  Complains of bloody stools; denies abdominal pain, change in bowel habits, constipation, dark tarry stools, diarrhea, indigestion, loss of appetite, nausea, vomiting, vomiting blood, and yellowish skin color; rare on TP. GU:  Complains of nocturia; denies discharge, dysuria, and urinary frequency; 1-2 varies. MS:  Complains of joint pain; denies muscle aches, cramps, and stiffness; R toe and left shoulder. . Derm:  Denies dryness, itching, and rash. Neuro:  Denies numbness, poor balance, tingling, and tremors.  Physical Exam  General:  alert, well-developed, well-nourished, and well-hydrated.  Obese. Waist 48...Marland Kitchenwears suspenders, slightly improved. Head:  Normocephalic and atraumatic without obvious abnormalities. No apparent alopecia or balding. Sinuses nontender. Eyes:  Conjunctiva clear bilaterally.  Ears:  External ear exam shows no significant lesions or deformities.  Otoscopic examination reveals clear canals, tympanic membranes are intact bilaterally without bulging, retraction, inflammation or discharge. Hearing is grossly normal bilaterally. Nose:  External nasal examination shows no deformity or inflammation. Nasal mucosa are pink and moist without lesions or exudates. Mouth:  Oral mucosa and oropharynx without lesions or exudates.  Teeth in good repair. Neck:  No deformities, masses, or tenderness noted. Chest Wall:  No deformities, masses, tenderness or gynecomastia noted. Breasts:  No masses or gynecomastia noted Lungs:  Normal respiratory effort, chest expands  symmetrically. Lungs are clear to auscultation, no crackles or wheezes. Heart:  Normal rate and regular rhythm. S1 and S2 normal without gallop, murmur, click, rub or other extra sounds. Abdomen:  Bowel sounds positive,abdomen soft and non-tender without masses, organomegaly or hernias noted. Protuberant. Rectal:  Declines  Genitalia:  Declines Prostate:  Declines Msk:  No deformity or scoliosis noted of thoracic or lumbar spine.   Pulses:  R and L carotid,radial,femoral,dorsalis pedis and posterior tibial pulses are full and equal bilaterally Extremities:  No clubbing, cyanosis, edema, or deformity noted with normal full range of motion of all joints.   Neurologic:  No cranial nerve deficits noted. Station and gait are normal. Sensory, motor and coordinative functions appear intact. Skin:  Intact without suspicious lesions or rashes Cervical Nodes:  No lymphadenopathy noted Inguinal Nodes:  No significant adenopathy Psych:  Cognition and judgment appear intact. Alert and cooperative with normal attention span and concentration. No apparent delusions, illusions, hallucinations   Impression & Recommendations:  Problem # 1:  HEALTH MAINTENANCE EXAM (ICD-V70.0) Assessment Comment Only  Problem # 2:  UNSPECIFIED VITAMIN D DEFICIENCY (ICD-268.9) Assessment: Improved Back to therapeutic. Cont 1000Iu daily.  Problem # 3:  HYPERTENSION (ICD-401.9) Change Metoprolol that he is not taking to Amlodipine. Recheck.  His updated medication list for this problem includes:    Metoprolol Succinate 25 Mg Xr24h-tab (Metoprolol succinate) ..... One tab by mouth at night.    Amlodipine Besylate 5 Mg Tabs (Amlodipine besylate) ..... One tab by mouth at night.  BP today: 140/100 Prior BP: 138/80 (04/01/2009)  Labs Reviewed: K+: 4.7 (06/27/2009) Creat: : 1.0 (06/27/2009)   Chol: 185 (06/27/2009)   HDL: 36.40 (06/27/2009)   LDL: 109 (06/27/2009)   TG: 196.0 (06/27/2009)  Problem # 4:  OBESITY  (ICD-278.00) Assessment: Improved  Slightluy improved. Encouraged.  Ht: 69.25 (04/01/2009)   Wt: 285.75 (07/02/2009)   BMI: 42.27 (04/01/2009)  Problem # 5:  SCREENING FOR MALIGNANT NEOPLASM, PROSTATE (ICD-V76.44) Assessment: Unchanged PSA ok, declined exam.  Problem # 6:  HYPERCHOLESTEROLEMIA, MILD 202/TRIG 217 (ICD-272.0) Assessment: Unchanged Discussed watching intake and exercising. Labs Reviewed: SGOT: 23 (06/27/2009)   SGPT: 28 (06/27/2009)   HDL:36.40 (06/27/2009), 29.9 (12/07/2007)  LDL:109 (06/27/2009), DEL (12/07/2007)  Chol:185 (06/27/2009), 196 (12/07/2007)  Trig:196.0 (06/27/2009), 233 (12/07/2007)  Problem # 7:  NICOTINE ADDICTION (ICD-305.1) Assessment: Unchanged Stayed off successfully...congrats and reinforced.  Complete Medication List: 1)  Soma 350 Mg Tabs (Carisoprodol) .Marland Kitchen.. 1 by mouth four times a day as needed 2)  Viagra Tabs (Sildenafil citrate tabs) .... Take one by mouth as directed as needed 3)  Ibuprofen 800 Mg Tabs (Ibuprofen) .Marland Kitchen.. 1 tablet every 6 hours as needed pain 4)  Vicodin 5-500 Mg Tabs (Hydrocodone-acetaminophen) .Marland Kitchen.. 1 every 6 hrs as needed pain 5)  Metoprolol Succinate 25 Mg Xr24h-tab (Metoprolol succinate) .... One tab by mouth at night. 6)  Vitamin D 1000 Iu  .... One daily 7)  Amlodipine Besylate 5 Mg Tabs (Amlodipine besylate) .... One tab by mouth at night.  Patient Instructions: 1)  Start Amlodipine at night. 2)  RTC in 3 months for BP check. 3)  Take Guaifenesin by going to [facility] or [facility] and getting MUCOUS RELIEF EXPECTORANT (400mg ), take 11/2 tabs by mouth AM and NOON. 4)  Drink lots of fluids anytime taking Guaifenesin.  5)  If no better, try Claritin OTC. Prescriptions: IBUPROFEN 800 MG  TABS (IBUPROFEN) 1 tablet every 6 hours as needed pain  #90 x 6   Entered and Authorized by:   Shaune Leeks MD   Signed by:   Shaune Leeks MD on 07/02/2009   Method used:   Electronically to        [facility] ([facility])       [address]       [place], [place]  [address]       Ph: [phone]       Fax: [phone]   RxID:   [id number] AMLODIPINE BESYLATE 5 MG TABS (AMLODIPINE BESYLATE) one tab by mouth at night.  #30 x 12   Entered and Authorized by:   Shaune Leeks MD   Signed by:   Shaune Leeks MD on 07/02/2009   Method used:   Electronically to        [facility] [address] (retail)       [address]       [place], [place]  [address]       Ph: [phone]       Fax: [phone]   RxID:   [id number]   Current Allergies (reviewed today): ! *  CODEINE

## 2010-06-10 NOTE — Progress Notes (Signed)
Summary: refill vicodan  Phone Note Refill Request Message from:  [facility] on December 18, 2008 9:44 AM  Refills Requested: Medication #1:  VICODIN 5-500 MG TABS 1 every 6 hrs as needed pain.   Last Refilled: 06/28/2008 [facility] [address]  [phone]  Initial call taken by: Providence Crosby LPN,  December 18, 2008 9:44 AM  Follow-up for Phone Call        prescription called to pharmacy line Follow-up by: Providence Crosby LPN,  December 18, 2008 11:19 AM    Prescriptions: VICODIN 5-500 MG TABS (HYDROCODONE-ACETAMINOPHEN) 1 every 6 hrs as needed pain  #40 x 1   Entered and Authorized by:   Shaune Leeks MD   Signed by:   Shaune Leeks MD on 12/18/2008   Method used:   Telephoned to ...       [facility] [address] (retail)       [address]       [place], [place]  [address]       Ph: [phone]       Fax: [phone]   RxID:   [id number]

## 2010-06-10 NOTE — Miscellaneous (Signed)
  Clinical Lists Changes  Observations: Added new observation of COLONNXTDUE: 04/2015 (12/13/2009 15:52) Added new observation of PSA: 2.11 ng/mL (12/22/2007 15:52) Added new observation of CREATININE: 1.00 mg/dL (82/95/6213 08:65) Added new observation of BUN: 13 mg/dL (78/46/9629 52:84) Added new observation of BG RANDOM: 88 mg/dL (13/24/4010 27:25) Added new observation of K SERUM: 5.5 meq/L (12/22/2007 15:52) Added new observation of LDL: 172 mg/dL (36/64/4034 74:25) Added new observation of HDL: 47 mg/dL (95/63/8756 43:32) Added new observation of TRIGLYC TOT: 183 mg/dL (95/18/8416 60:63) Added new observation of CHOLESTEROL: 259 mg/dL (01/60/1093 23:55) Added new observation of COLONOSCOPY: Diverticulosis  (04/17/2005 15:54)      Preventive Care Screening  Colonoscopy:    Date:  04/17/2005    Next Due:  04/2015    Results:  Diverticulosis

## 2010-06-10 NOTE — Progress Notes (Signed)
Summary: refill soma for Dr. Hetty Ely patient  Phone Note Refill Request Message from:  [facility] on October 19, 2008 9:09 AM  Refills Requested: Medication #1:  SOMA 350 MG  TABS 1 by mouth four times a day as needed   Last Refilled: 08/10/2008 refill soma at [facility] [address] [phone]  Initial call taken by: Providence Crosby,  October 19, 2008 9:10 AM  Follow-up for Phone Call        prescribtion called to pharmacy Follow-up by: Providence Crosby,  October 19, 2008 9:51 AM      Prescriptions: SOMA 350 MG  TABS (CARISOPRODOL) 1 by mouth four times a day as needed  #120 x 2   Entered and Authorized by:   Hannah Beat MD   Signed by:   Hannah Beat MD on 10/19/2008   Method used:   Telephoned to ...       [facility] (retail)       [address]       [place], [place]  [address]       Ph: [phone]       Fax: [phone]   RxID:   [id number]

## 2010-06-10 NOTE — Progress Notes (Signed)
Summary: refill vicodan  Phone Note Refill Request Message from:  [facility] on Sep 18, 2008 8:23 AM  Refills Requested: Medication #1:  VICODIN 5-500 MG TABS 1 every 6 hrs as needed pain.   Last Refilled: 06/28/2008 [facility] [phone]  Initial call taken by: Providence Crosby,  Sep 18, 2008 8:23 AM  Follow-up for Phone Call        prescribtion called to pharmacy Follow-up by: Providence Crosby,  Sep 18, 2008 8:27 AM      Prescriptions: VICODIN 5-500 MG TABS (HYDROCODONE-ACETAMINOPHEN) 1 every 6 hrs as needed pain  #40 x 0   Entered and Authorized by:   Shaune Leeks MD   Signed by:   Shaune Leeks MD on 09/18/2008   Method used:   Telephoned to ...       [facility])       [address]       [place], [place]  [address]       Ph: [phone]       Fax: [phone]   RxID:   [id number]

## 2010-06-10 NOTE — Progress Notes (Signed)
Summary: refill request for vicodin  Phone Note Refill Request Message from:  Fax from Pharmacy  Refills Requested: Medication #1:  VICODIN 5-500 MG TABS 1 Q6HOURS as needed PAIN.   Last Refilled: 05/01/2008 Faxed form from [facility] [address] is on your shelf.  Initial call taken by: Lowella Petties,  June 28, 2008 4:16 PM  Follow-up for Phone Call        PRESCRIBTION FAXED TO PHARMACY PAST BEING SIGNED BY DR. Hetty Ely Follow-up by: Providence Crosby,  June 28, 2008 5:31 PM      Prescriptions: VICODIN 5-500 MG TABS (HYDROCODONE-ACETAMINOPHEN) 1 Q6HOURS as needed PAIN  #50 x 0   Entered and Authorized by:   Shaune Leeks MD   Signed by:   Shaune Leeks MD on 06/28/2008   Method used:   Telephoned to ...       [facility] [address] ([facility])       [address]       [place], [place]  [address]       Ph: [phone]       Fax: [phone]   RxID:   [id number]

## 2010-06-10 NOTE — Assessment & Plan Note (Signed)
Summary: mole on face/dlo   Vital Signs:  Patient Profile:   55 Years Old Male Height:     69.50 inches Weight:      279 pounds Temp:     98.3 degrees F oral Pulse rate:   76 / minute Pulse rhythm:   regular BP sitting:   140 / 90  (left arm) Cuff size:   large  Vitals Entered By: Providence Crosby (February 29, 2008 4:07 PM)                 Chief Complaint:  CHECK MOLE ON FACE// CHECK BLOOD PRESSURE RUNNING HIGH SINCE ON CHANTIX // QUIT SMOKING.  History of Present Illness: Here for mole on the face just anterior to the tragus of the right ear that has been hit shaving and getting irritated. It may be slightly larger than before and slightly darker, but otherwise bleeds when hit. He also has noticed being more irritated lately and flies off the handle more easily since stopping smoking. Has been off cigarettes for a few months, has gained a little weight as well.    Current Allergies: ! * CODEINE      Physical Exam  General:     Well-developed,well-nourished,in no acute distress; alert,appropriate and cooperative throughout examination Head:     Normocephalic and atraumatic without obvious abnormalities. No apparent alopecia or balding. Eyes:     Conjunctiva clear bilaterally.  Ears:     External ear exam shows no significant lesions or deformities.  Otoscopic examination reveals clear canals, tympanic membranes are intact bilaterally without bulging, retraction, inflammation or discharge. Hearing is grossly normal bilaterally. Nose:     External nasal examination shows no deformity or inflammation. Nasal mucosa are pink and moist without lesions or exudates. Mouth:     Oral mucosa and oropharynx without lesions or exudates.  Teeth in good repair. Neck:     No deformities, masses, or tenderness noted. Chest Wall:     No deformities, masses, tenderness or gynecomastia noted. Lungs:     Normal respiratory effort, chest expands symmetrically. Lungs are clear to  auscultation, no crackles or wheezes. Heart:     Normal rate and regular rhythm. S1 and S2 normal without gallop, murmur, click, rub or other extra sounds. Skin:     4mm raised hyperpigmented weldelineated smooth lesion, part of which is missing from recent shave accdt. Lesion prepped in usual sterile manner and numbed with 1% lido w/o epi. Lesion treated by dessication and curretage x 3. Sterile drsg with neosporin placed. Pt tolerated well. Psych:     Cognition and judgment appear intact. Alert and cooperative with normal attention span and concentration. No apparent delusions, illusions, hallucinations    Impression & Recommendations:  Problem # 1:  NEOPLASM OF UNCERTAIN BEHAVIOR OF SKIN (ICD-238.2) Assessment: Improved Removed via dessication and curretage. Clean and dry for 24hrs. Orders: Excise lesion ([facility]) 0 - 0.5 cm (11420)   Problem # 2:  AGITATION (ICD-307.9) Assessment: New Discussed regular exercise and hopeful weight loss, which will help. Longer off cigarettes will help. Discussed use of Zoloft. Would like to hold off. Discussed pros and cons. RTC if he thinks he needs med.  Complete Medication List: 1)  Soma 350 Mg Tabs (Carisoprodol) .Marland Kitchen.. 1 by mouth four times a day as needed 2)  Viagra Tabs (Sildenafil citrate tabs) .... Prn 3)  Ibuprofen 800 Mg Tabs (Ibuprofen) .Marland Kitchen.. 1 tablet q 6 hours as needed pain 4)  Hycodan 5-1.5 Mg/46ml Syrp (Hydrocodone-homatropine) .Marland KitchenMarland KitchenMarland Kitchen  One tsppo at night 5)  Vicodin 5-500 Mg Tabs (Hydrocodone-acetaminophen) .Marland Kitchen.. 1 q6hours as needed pain   Patient Instructions: 1)  RTC as needed.   ]

## 2010-06-10 NOTE — Assessment & Plan Note (Addendum)
Summary: CPX/CLE   Vital Signs:  Patient Profile:   55 Years Old Male Height:     69.50 inches Weight:      260 pounds Temp:     98.5 degrees F oral Pulse rate:   80 / minute Pulse rhythm:   regular BP sitting:   140 / 90  (left arm) Cuff size:   large  Vitals Entered By: Providence Crosby (January 13, 2007 3:13 PM)                 Chief Complaint:  check up.  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  Leg has now healed. Right knee still bothers him sometimes, saw Ortho and said to wait ...hurts off and on at times. Right shoulder also bothers him at times and Ibp and SOMA helps too. Has lesion on neck he would like looked at.  Current Allergies (reviewed today): ! CODEINE   Family History:    Father: Alive 45  Stent Skin Ca    Mother: Alive, HTN    BrotherA , HTN    Brother A    CV - none    HBP + Bro, mother    Cancer - none    Depression - none    Strokes - none    ETOH/Drug abuse - none    GM has Parkinson's disease    Uncle has diabetes   Risk Factors:     Year started:  46    Counseled to quit/cut down tobacco use:  yes Passive smoke exposure:  no Drug use:  no HIV high-risk behavior:  no Caffeine use:  4 drinks per day Alcohol use:  yes    Type:  beer    Drinks per day:  <1    Has patient --       Felt need to cut down:  no       Been annoyed by complaints:  no       Felt guilty about drinking:  no       Needed eye opener in the morning:  no    Counseled to quit/cut down alcohol use:  no Exercise:  no Seatbelt use:  100 %   Review of Systems  General      Denies chills, fatigue, fever, loss of appetite, malaise, sleep disorder, sweats, weakness, and weight loss.  Eyes      Denies blurring, discharge, double vision, eye irritation, eye pain, halos, itching, light sensitivity, red eye, vision loss-1 eye, and vision loss-both eyes.  ENT      Denies decreased hearing, difficulty swallowing, ear discharge,  earache, hoarseness, nasal congestion, nosebleeds, postnasal drainage, ringing in ears, sinus pressure, and sore throat.  CV      Denies bluish discoloration of lips or nails, chest pain or discomfort, difficulty breathing at night, difficulty breathing while lying down, fainting, fatigue, leg cramps with exertion, lightheadness, near fainting, palpitations, shortness of breath with exertion, swelling of feet, swelling of hands, and weight gain.  Resp      Denies chest discomfort, chest pain with inspiration, cough, coughing up blood, excessive snoring, hypersomnolence, morning headaches, pleuritic, shortness of breath, sputum productive, and wheezing.  GI      Complains of indigestion.      Denies abdominal pain, bloody stools, change in bowel habits, constipation, dark tarry stools, diarrhea, excessive appetite, gas, hemorrhoids, loss of appetite, nausea, vomiting, vomiting blood, and yellowish skin color.  GU  Complains of nocturia.      once  MS      R shoulder and Knee and elbow  Derm      Denies changes in color of skin, changes in nail beds, dryness, excessive perspiration, flushing, hair loss, insect bite(s), itching, lesion(s), poor wound healing, and rash.  Neuro      Denies brief paralysis, difficulty with concentration, disturbances in coordination, falling down, headaches, inability to speak, memory loss, numbness, poor balance, seizures, sensation of room spinning, tingling, tremors, visual disturbances, and weakness.   Physical Exam  General:     Well-developed,well-nourished,in no acute distress; alert,appropriate and cooperative throughout examination. Mildly obese Head:     Normocephalic and atraumatic without obvious abnormalities. No apparent alopecia or balding. Eyes:     Conjunctiva clear bilaterally.  Ears:     External ear exam shows no significant lesions or deformities.  Otoscopic examination reveals clear canals, tympanic membranes are intact  bilaterally without bulging, retraction, inflammation or discharge. Hearing is grossly normal bilaterally. Nose:     External nasal examination shows no deformity or inflammation. Nasal mucosa are pink and moist without lesions or exudates. Mouth:     Oral mucosa and oropharynx without lesions or exudates.  Teeth in good repair. Neck:     No deformities, masses, or tenderness noted. Chest Wall:     No deformities, masses, tenderness or gynecomastia noted. Breasts:     No masses or gynecomastia noted Lungs:     Normal respiratory effort, chest expands symmetrically. Lungs are clear to auscultation, no crackles or wheezes. Heart:     Normal rate and regular rhythm. S1 and S2 normal without gallop, murmur, click, rub or other extra sounds. Abdomen:     Bowel sounds positive,abdomen soft and non-tender without masses, organomegaly or hernias noted. Mildly obese. Rectal:     No external abnormalities noted. Normal sphincter tone. No rectal masses or tenderness. G neg. Genitalia:     Testes bilaterally descended without nodularity, tenderness or masses. No scrotal masses or lesions. No penis lesions or urethral discharge. Prostate:     Prostate gland firm and smooth, no enlargement, nodularity, tenderness, mass, asymmetry or induration. 10gms. Msk:     No deformity or scoliosis noted of thoracic or lumbar spine.   Pulses:     R and L carotid,radial,femoral,dorsalis pedis and posterior tibial pulses are full and equal bilaterally Extremities:     No clubbing, cyanosis, edema, or deformity noted with normal full range of motion of all joints.   Neurologic:     No cranial nerve deficits noted. Station and gait are normal. Plantar reflexes are down-going bilaterally. DTRs are symmetrical throughout. Sensory, motor and coordinative functions appear intact. Skin:     hard benign ppule of right posterior neck. Cervical Nodes:     No lymphadenopathy noted Inguinal Nodes:     No significant  adenopathy Psych:     Cognition and judgment appear intact. Alert and cooperative with normal attention span and concentration. No apparent delusions, illusions, hallucinations    Impression & Recommendations:  Problem # 1:  HEALTH MAINTENANCE EXAM (ICD-V70.0) Assessment: Comment Only  Problem # 2:  HYPERCHOLESTEROLEMIA, MILD 202/TRIG 217 (ICD-272.0) Assessment: Improved Good control of LDL, needs to decrease sweets and carbs to decrease Trig and increase exercise to effect weight loss and incr HDL.  Problem # 3:  NICOTINE ADDICTION (ICD-305.1) Assessment: Unchanged Encouraged to quit.  Problem # 4:  KNEE PAIN, RIGHT (ICD-719.46) Assessment: Unchanged Continues...neck pain helped by  meds listed. His updated medication list for this problem includes:    Soma Tabs (Carisoprodol tabs) .Marland Kitchen... Prn    Bl Ibuprofen Tabs (Ibuprofen tabs) .Marland Kitchen... As needed    Vicodin 5-500 Mg Tabs (Hydrocodone-acetaminophen) .Marland Kitchen... 1 qhs   Problem # 5:  SLEEP APNEA, OBSTRUCTIVE, MILD (ICD-327.23) Assessment: Unchanged is not on CPAP, hasn't tried it...he's not convinced he needs it.  Complete Medication List: 1)  Soma Tabs (Carisoprodol tabs) .... Prn 2)  Bl Ibuprofen Tabs (Ibuprofen tabs) .... As needed 3)  Viagra Tabs (Sildenafil citrate tabs) .... Prn 4)  Vicodin 5-500 Mg Tabs (Hydrocodone-acetaminophen) .Marland Kitchen.. 1 qhs   Patient Instructions: 1)  Discusse heat and ice for knee and neck. Use heat on skin lesion.  Appended Document: CPX/CLE    Clinical Lists Changes  Observations: Added new observation of PAST SURG HX: therapeutic Nasal Fracture 1976 flex sig 09/06/1998 w/o neoplasm MRI Cerv. spine without DDD 4/5, bulg 5/6, narrowing 5/6 10/30/2002 Sleep study, mild obstruc. sleep apnea 04/17/05 (07/21/2010 8:40)       Past History:  Past Surgical History: therapeutic Nasal Fracture 1976 flex sig 09/06/1998 w/o neoplasm MRI Cerv. spine without DDD 4/5, bulg 5/6, narrowing 5/6  10/30/2002 Sleep study, mild obstruc. sleep apnea 04/17/05

## 2010-06-10 NOTE — Progress Notes (Signed)
Summary: vicodin  Phone Note Refill Request Call back at Home Phone [phone] Message from:  Patient on July 05, 2009 2:35 PM  Refills Requested: Medication #1:  VICODIN 5-500 MG TABS 1 every 6 hrs as needed pain [facility] [address]   Initial call taken by: Melody Comas,  July 05, 2009 2:35 PM  Follow-up for Phone Call        [facility] Follow-up by: Lowella Petties CMA,  July 08, 2009 10:34 AM    Prescriptions: VICODIN 5-500 MG TABS (HYDROCODONE-ACETAMINOPHEN) 1 every 6 hrs as needed pain  #40 x 0   Entered and Authorized by:   Shaune Leeks MD   Signed by:   Shaune Leeks MD on 07/08/2009   Method used:   Telephoned to ...       [facility] [address] ([facility]ail)       [address], [address]       [place], [place]  [address]       Ph: [phone]       Fax: [phone]   RxID:   [id number]   Appended Document: vicodin Patient called to check to see if the Rx for Vicodin was called in, advised that it was called in this morning.

## 2010-06-10 NOTE — Assessment & Plan Note (Signed)
Summary: COUGH/CLE   Vital Signs:  Patient Profile:   55 Years Old Male Height:     69.50 inches Weight:      272 pounds Temp:     98.9 degrees F oral Pulse rate:   88 / minute Pulse rhythm:   regular BP sitting:   150 / 90  (left arm) Cuff size:   large  Vitals Entered By: Delilah Shan (September 01, 2007 4:49 PM)                  Chief Complaint:  Cough.  Acute Visit History:      The patient complains of cough and sore throat.  These symptoms began 4 days ago.  He denies earache, fever, headache, and sinus problems.  Other comments include: fatigue chest congestion.        The character of the cough is described as productive.          Past Medical History:    Reviewed history and no changes required:       Current Problems:        URI (ICD-465.9)       HEALTH MAINTENANCE EXAM (ICD-V70.0)       SCREENING FOR MALIGNANT NEOPLASM, PROSTATE (ICD-V76.44)       SLEEP APNEA, OBSTRUCTIVE, MILD (ICD-327.23)       ERECTILE DYSFUNCTION (ICD-302.72)       HYPERCHOLESTEROLEMIA, MILD 202/TRIG 217 (ICD-272.0)       NICOTINE ADDICTION (ICD-305.1)       KNEE PAIN, RIGHT (ICD-719.46)           Social History:    Reviewed history from 12/30/2006 and no changes required:       Marital Status: Remarried as is his wife       Children: One step son       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 EOMI. Perrla. Vision grossly normal. Ears:     External ear exam shows no significant lesions or deformities.  Otoscopic examination reveals clear canals, tympanic membranes are intact bilaterally without bulging, retraction, inflammation or discharge. Hearing is grossly normal bilaterally. Nose:     External nasal examination shows no deformity or  inflammation. Nasal mucosa are pink and moist without lesions or exudates. Mouth:     Oral mucosa and oropharynx without lesions or exudates.  Teeth in good repair. Lungs:     Normal respiratory effort, chest expands symmetrically. Lungs are clear to auscultation, no crackles or wheezes. Heart:     Normal rate and regular rhythm. S1 and S2 normal without gallop, murmur, click, rub or other extra sounds. Cervical Nodes:     no cervical or supraclavicular lymphadenopathy     Impression & Recommendations:  Problem # 1:  URI (ICD-465.9) Symptomatic care.  If not improving in 7-10 days call.  The following medications were removed from the medication list:    Tussionex Pennkinetic Er 8-10 Mg/88ml Lqcr (Chlorpheniramine-hydrocodone) ..... One tsp by mouth bid  His updated medication list for this problem includes:    Ibuprofen 800 Mg Tabs (Ibuprofen) .Marland Kitchen... 1 tablet q 6 hours as needed pain    Guaifenesin-codeine 100-10 Mg/41ml Liqd (Guaifenesin-codeine) .Marland Kitchen... 1-2 tsp by mouth qhs   Complete Medication List: 1)  Soma  350 Mg Tabs (Carisoprodol) .Marland Kitchen.. 1 by mouth four times a day as needed 2)  Viagra Tabs (Sildenafil citrate tabs) .... Prn 3)  Ibuprofen 800 Mg Tabs (Ibuprofen) .Marland Kitchen.. 1 tablet q 6 hours as needed pain 4)  Guaifenesin-codeine 100-10 Mg/90ml Liqd (Guaifenesin-codeine) .Marland Kitchen.. 1-2 tsp by mouth qhs      Prescriptions: GUAIFENESIN-CODEINE 100-10 MG/5ML  LIQD (GUAIFENESIN-CODEINE) 1-2 tsp by mouth qHS  #8 oz x 0   Entered and Authorized by:   Kerby Nora MD   Signed by:   Kerby Nora MD on 09/01/2007   Method used:   Print then Give to Patient   RxID:   [email or web address]  ]  Current Medications (including changes made in today's visit):  SOMA 350 MG  TABS (CARISOPRODOL) 1 by mouth four times a day as needed VIAGRA   TABS (SILDENAFIL CITRATE TABS) prn IBUPROFEN 800 MG  TABS (IBUPROFEN) 1 tablet q 6 hours as needed pain GUAIFENESIN-CODEINE 100-10 MG/5ML  LIQD (GUAIFENESIN-CODEINE)  1-2 tsp by mouth qHS

## 2010-06-10 NOTE — Progress Notes (Signed)
Summary: refill on Vicodin  Phone Note Refill Request Message from:  [employer] on Sep 14, 2008 4:07 PM  Refills Requested: Medication #1:  VICODIN 5-500 MG TABS 1 Q6HOURS as needed PAIN   Last Refilled: 06/28/2008 In Dr. Lorenza Chick absence.[address] [facility] [address] [phone]  Initial call taken by: Silas Sacramento CMA,  Sep 14, 2008 4:08 PM  Follow-up for Phone Call        call him, the hycodan cough syrup that I Rxed this morning is the same thing as vicodin 5/500 that he is asking for a refill of--he can usse the cough med for his knee pain this weekend and will refill tabs on Monday or Tues or if has not picked up the cough syrup, we can cancel it and then do the pills---Billie-Lynn Tyler Deis FNP  Sep 14, 2008 4:37 PM   [facility] for patient to call back. Sep 14, 2008 Nedra Hai [facility], [place]   Additional Follow-up for Phone Call Additional follow up Details #1::        Left message at work for patient to call us back. Additional Follow-up by: Lewanda Rife,  Sep 17, 2008 11:15 AM    Additional Follow-up for Phone Call Additional follow up Details #2::    Spoke with patient. He is almost out of Hycodan and would like for you to fill the Vicodin5-500 mg. tabs at [facility] [address] He feels some better but is still coughing. Follow-up by: Lewanda Rife,  Sep 17, 2008 4:47 PM  Additional Follow-up for Phone Call Additional follow up Details #3:: Details for Additional Follow-up Action Taken: has been called in   Billie-Lynn [employer] FNP  Sep 17, 2008 5:14 PM

## 2010-06-10 NOTE — Progress Notes (Signed)
       New/Updated Medications: SOMA 350 MG  TABS (CARISOPRODOL) 1 by mouth qid   Prescriptions: SOMA 350 MG  TABS (CARISOPRODOL) 1 by mouth qid  #50 x 2   Entered by:   Liane Comber   Authorized by:   Shaune Leeks MD   Signed by:   Liane Comber on 02/15/2007   Method used:   Electronically sent to ...       [facility] [address]       [address], [address]       [place], [place]  [address]       Ph: [phone]       Fax: [phone]   RxID:   [id number]     Prior Medications: BL IBUPROFEN   TABS (IBUPROFEN TABS) as needed VIAGRA   TABS (SILDENAFIL CITRATE TABS) prn VICODIN 5-500 MG  TABS (HYDROCODONE-ACETAMINOPHEN) 1 qhs Current Allergies: ! CODEINE

## 2010-06-10 NOTE — Assessment & Plan Note (Signed)
Summary: COLD,COUGH/CLE   Vital Signs:  Patient profile:   55 year old male Height:      69.25 inches Weight:      287.25 pounds BMI:     42.27 Temp:     98.8 degrees F oral Pulse rate:   84 / minute Pulse rhythm:   regular BP sitting:   132 / 78  (left arm) Cuff size:   large  Vitals Entered By: Delilah Shan CMA Shaddai Shapley Dull) (April 01, 2010 3:05 PM) CC: Cold, cough, congestion.  Pt. says he is only on one BP med, not sure which one.   History of Present Illness: URI symptoms.  Started with ST Friday.  Sick exposure at work. Monday- increase in congestion.  Out of work today and yesterday.  Inc cough today.  Better except for the cough and resultant pain.  Still with some rhinorrhea.  Tired.  Upper chest/throat sore with cough.  voice change noted, hoarse yesterday.    Allergies: 1)  ! 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  Otherwise negative.    Physical Exam  General:  GEN: nad, alert and oriented HEENT: mucous membranes moist, TM w/o erythema, nasal epithelium injected, OP with cobblestoning NECK: supple w/o LA CV: rrr. PULM: ctab, no inc wob ABD: soft, +bs, obese EXT: no edema    Impression & Recommendations:  Problem # 1:  URI (ICD-465.9) sample of albuterol given.  Likely viral process that should resolve.  he can use SABA as needed cough and tussionex if no relief.   follow up as needed.   Pt to call back and verify BP meds.  His updated medication list for this problem includes:    Ibuprofen 800 Mg Tabs (Ibuprofen) .Marland Kitchen... 1 tablet every 6 hours as needed pain    Tussionex Pennkinetic Er 10-8 Mg/29ml Lqcr (Hydrocod polst-chlorphen polst) .Marland KitchenMarland KitchenMarland KitchenMarland Kitchen 5ml by mouth two times a day as needed cough  Complete Medication List: 1)  Soma 350 Mg Tabs (Carisoprodol) .Marland Kitchen.. 1 by mouth four times a day as needed 2)  Viagra Tabs (Sildenafil citrate tabs) .... Take one by mouth as directed as needed 3)   Ibuprofen 800 Mg Tabs (Ibuprofen) .Marland Kitchen.. 1 tablet every 6 hours as needed pain 4)  Vicodin 5-500 Mg Tabs (Hydrocodone-acetaminophen) .Marland Kitchen.. 1 every 6 hrs as needed pain 5)  Metoprolol Succinate 25 Mg Xr24h-tab (Metoprolol succinate) .... One tab by mouth at night. 6)  Amlodipine Besylate 5 Mg Tabs (Amlodipine besylate) .... One tab by mouth at night. 7)  Tussionex Pennkinetic Er 10-8 Mg/37ml Lqcr (Hydrocod polst-chlorphen polst) .... 5ml by mouth two times a day as needed cough  Patient Instructions: 1)  Use the tussionex twice a day as needed.  It may make you drowsy.  Use the albuterol 2 puffs every 4 hours as needed.  Take care.  Prescriptions: Sandria Senter ER 10-8 MG/5ML LQCR (HYDROCOD POLST-CHLORPHEN POLST) 5ml by mouth two times a day as needed cough  #4oz x 0   Entered and Authorized by:   Crawford Givens MD   Signed by:   Crawford Givens MD on 04/01/2010   Method used:   Print then Give to Patient   RxID:   [id number]    Orders Added: 1)  Est. Patient Level III [14782]    Current Allergies (reviewed today): ! * CODEINE

## 2010-06-10 NOTE — Progress Notes (Signed)
Summary: Rx Vicodin  Phone Note Refill Request Call back at [phone] Message from:  [facility] on January 27, 2010 9:29 AM  Refills Requested: Medication #1:  VICODIN 5-500 MG TABS 1 every 6 hrs as needed pain   Last Refilled: 12/25/2009  Method Requested: Telephone to Pharmacy Initial call taken by: Sydell Axon LPN,  January 27, 2010 9:30 AM  Follow-up for Phone Call        please call in and have patient follow up with either me or Dr. Hetty Ely this fall.  Follow-up by: Crawford Givens MD,  January 27, 2010 12:45 PM  Additional Follow-up for Phone Call Additional follow up Details #1::        Medication phoned to pharmacy. Patient Advised.  Additional Follow-up by: Delilah Shan CMA (AAMA),  January 27, 2010 1:41 PM    Prescriptions: VICODIN 5-500 MG TABS (HYDROCODONE-ACETAMINOPHEN) 1 every 6 hrs as needed pain  #40 x 2   Entered and Authorized by:   Crawford Givens MD   Signed by:   Crawford Givens MD on 01/27/2010   Method used:   Telephoned to ...       [facility] [address]ail)       [address], [address]       [place], [place]  [address]       Ph: [phone]       Fax: [phone]   RxID:   [id number]

## 2010-06-10 NOTE — Letter (Signed)
Summary: Results Follow up Letter  [place] [facility]  [address] [place], [place] [address]   Phone: [phone]  Fax: [phone]    07/15/2009 MRN: [id number]  Juan Thompson [address] [place], [place]  [address]  Dear Mr. Gruver,  The following are the results of your recent test(s):  Test         Result    Pap Smear:        Normal _____  Not Normal _____ Comments: ______________________________________________________ Cholesterol: LDL(Bad cholesterol):         Your goal is less than:         HDL (Good cholesterol):       Your goal is more than: Comments:  ______________________________________________________ Mammogram:        Normal _____  Not Normal _____ Comments:  ___________________________________________________________________ Hemoccult:        Normal __X___  Not normal _______ Comments:  Please repeat in one year.  _____________________________________________________________________ Other Tests:    We routinely do not discuss normal results over the telephone.  If you desire a copy of the results, or you have any questions about this information we can discuss them at your next office visit.   Sincerely,     Laurita Quint, MD

## 2010-06-10 NOTE — Progress Notes (Signed)
Summary: Rx Vicodin  Phone Note Refill Request Call back at [phone] Message from:  [facility] [address] on Sep 18, 2009 3:03 PM  Refills Requested: Medication #1:  VICODIN 5-500 MG TABS 1 every 6 hrs as needed pain   Last Refilled: 08/15/2009 Received e-scribe refill request.   Method Requested: Telephone to Pharmacy Initial call taken by: Sydell Axon LPN,  Sep 18, 2009 3:03 PM  Follow-up for Phone Call        Rx called to pharmacy Follow-up by: Sydell Axon LPN,  Sep 18, 2009 4:56 PM    Prescriptions: VICODIN 5-500 MG TABS (HYDROCODONE-ACETAMINOPHEN) 1 every 6 hrs as needed pain  #40 x 5   Entered and Authorized by:   Shaune Leeks MD   Signed by:   Shaune Leeks MD on 09/18/2009   Method used:   Telephoned to ...       [facility] [address]       [address]       [place], [place]  [address]       Ph: [phone]       Fax: [phone]   RxID:   [id number]

## 2010-06-10 NOTE — Assessment & Plan Note (Signed)
Summary: flu shot/dlo  Nurse Visit    Prior Medications: SOMA 350 MG  TABS (CARISOPRODOL) 1 by mouth qid BL IBUPROFEN   TABS (IBUPROFEN TABS) as needed VIAGRA   TABS (SILDENAFIL CITRATE TABS) prn VICODIN 5-500 MG  TABS (HYDROCODONE-ACETAMINOPHEN) 1 qhs Current Allergies: ! CODEINE    Orders Added: 1)  Flu Vaccine 24yrs + [90658] 2)  Admin 1st Vaccine [email or web address]    ]  Influenza Vaccine    Vaccine Type: Fluvax 3+    Site: right deltoid    Mfr: Sanofi Pasteur    Dose: 0.5 ml    Route: IM    Given by: Providence Crosby    Exp. Date: 11/08/2007    Lot #: [id number]    VIS given: 11/07/04 version given March 25, 2007.  Flu Vaccine Consent Questions    Do you have a history of severe allergic reactions to this vaccine? no    Any prior history of allergic reactions to egg and/or gelatin? no    Do you have a sensitivity to the preservative Thimersol? no    Do you have a past history of Guillan-Barre Syndrome? no    Do you currently have an acute febrile illness? no    Have you ever had a severe reaction to latex? no    Vaccine information given and explained to patient? yes

## 2010-06-10 NOTE — Progress Notes (Signed)
Summary: c pap machine   Phone Note Call from Patient   Caller: Patient Call For: clance Summary of Call: pt need order for c pap machine. Initial call taken by: Rickard Patience,  February 10, 2010 4:15 PM  Follow-up for Phone Call        Pt was last seem by [name] 1.8.2010 and told to f/u in 4 wks.    Called home number -- spoke with [name] RN  February 10, 2010 4:47 PM   Pt returned call.  He states he never started cpap d/t cost issues.  Would like order for cpap now.  Informed him it has been over 1 yr since last seen by [name] so he will need to come in.  He is ok with this -- OV scheduled for 10.6.11 at 2:15pm -- pt aware.   Follow-up by: Gweneth Dimitri RN,  February 10, 2010 4:53 PM

## 2010-06-10 NOTE — Assessment & Plan Note (Signed)
Summary: COUGH/CLE   Vital Signs:  Patient Profile:   55 Years Old Male Height:     69.50 inches Weight:      266 pounds Temp:     98.8 degrees F oral Pulse rate:   72 / minute Pulse rhythm:   regular BP sitting:   130 / 80  (left arm) Cuff size:   large  Vitals Entered By: Providence Crosby (May 19, 2007 12:18 PM)                 Chief Complaint:  COUGH AND CONGESTION.  History of Present Illness: Started with congestion and cough Sat night after planing boards without a dust mask...has continued with cough and cough.  No fever except early this AM had cold chills and mild sweating at night. Cough is productive of brownish from smoking.  Current Allergies (reviewed today): ! CODEINE      Physical Exam  General:     Well-developed,well-nourished,in no acute distress; alert,appropriate and cooperative throughout examination, congested and mildly hoarse. Head:     Normocephalic and atraumatic without obvious abnormalities. No apparent alopecia or balding. Sinuses nontender. Eyes:     Conjunctiva clear bilaterally.  Ears:     External ear exam shows no significant lesions or deformities.  Otoscopic examination reveals clear canals, tympanic membranes are intact bilaterally without bulging, retraction, inflammation or discharge. Hearing is grossly normal bilaterally. Nose:     Congested with thichtan discharge. Mouth:     Oral mucosa and oropharynx without lesions or exudates.  Teeth in good repair. Thich, tan PND. Neck:     No deformities, masses, or tenderness noted. Lungs:     Normal respiratory effort, chest expands symmetrically. Lungs are clear to auscultation, no crackles or wheezes. Heart:     Normal rate and regular rhythm. S1 and S2 normal without gallop, murmur, click, rub or other extra sounds.    Impression & Recommendations:  Problem # 1:  URI (ICD-465.9) Assessment: New See instructions. His updated medication list for this problem includes:   Bl Ibuprofen Tabs (Ibuprofen tabs) .Marland Kitchen... As needed    Tussionex Pennkinetic Er 8-10 Mg/71ml Lqcr (Chlorpheniramine-hydrocodone) ..... One tsp by mouth bid Instructed on symptomatic treatment. Call if symptoms persist or worsen.   Complete Medication List: 1)  Soma 350 Mg Tabs (Carisoprodol) .Marland Kitchen.. 1 by mouth qid 2)  Bl Ibuprofen Tabs (Ibuprofen tabs) .... As needed 3)  Viagra Tabs (Sildenafil citrate tabs) .... Prn 4)  Vicodin 5-500 Mg Tabs (Hydrocodone-acetaminophen) .Marland Kitchen.. 1 qhs 5)  Tussionex Pennkinetic Er 8-10 Mg/23ml Lqcr (Chlorpheniramine-hydrocodone) .... One tsp by mouth bid 6)  Biaxin 500 Mg Tabs (Clarithromycin) .... One tab by mouth bid   Patient Instructions: 1)  Guaifenesen, pills or liquid, 600mg  by mouth AM and NOON for the next 4-5 days. 2)  Tylenol ES two tabs by mouth four times a day as needed 3)                                 or 4)  Aleeve two tabs by mouth with breakfast and supper. 5)  Push fluids.  6)  Gargles  7)  Fluids 8)  GUAIFENESIN  600mg  by mouth AM and NOON   9)    ROBITUSSIN PLAIN (NO LETTERS, NO NAMES) two tablespoons  or 10)    [facility] MUCOUS RELIEF EXPECTORANT (400 mg) 11/2 TABS  or 11)    GUAIFENESIN (200  MG) 3 TABS                    12)  If sxs don't improve, start Biaxin.    Prescriptions: Sandria Senter ER 8-10 MG/5ML  LQCR (CHLORPHENIRAMINE-HYDROCODONE) one tsp by mouth bid  #8 oz x 0   Entered and Authorized by:   Shaune Leeks MD   Signed by:   Shaune Leeks MD on 05/19/2007   Method used:   Print then Give to Patient   RxID:   [id number] BIAXIN 500 MG  TABS (CLARITHROMYCIN) one tab by mouth bid  #20 x 0   Entered and Authorized by:   Shaune Leeks MD   Signed by:   Shaune Leeks MD on 05/19/2007   Method used:   Print then Give to Patient   RxID:   [id number] Sandria Senter ER 8-10 MG/5ML  LQCR (CHLORPHENIRAMINE-HYDROCODONE) one tsp by mouth bid  #8 oz x 0   Entered and Authorized  by:   Shaune Leeks MD   Signed by:   Shaune Leeks MD on 05/19/2007   Method used:   Electronically sent to ...       [facility] [address]       [address]       [place], [place]  [address]       Ph: [phone]       Fax: [phone]   RxID:   [id number]  ]

## 2010-06-10 NOTE — Letter (Signed)
Summary: Nadara Eaton letter  [place] at [facility]  [address] [place], [place] [address]   Phone: [phone]  Fax: [phone]       12/17/2009 MRN: [id number]  YARIEL FERRARIS [address] [place], [place]  [address]  Dear Mr. [facility] [place] and [place] announce the retirement of Arta Silence, M.D., from full-time practice at the [facility] office effective November 07, 2009 and his plans of returning part-time.  It is important to Dr. Hetty Ely and to our practice that you understand that [facility] has seven physicians in our office for your health care needs.  We will continue to offer the same exceptional care that you have today.    Dr. Hetty Ely has spoken to many of you about his plans for retirement and returning part-time in the fall.   We will continue to work with you through the transition to schedule appointments for you in the office and meet the high standards that [place] is committed to.   Again, it is with great pleasure that we share the news that Dr. Hetty Ely will return to [facility] at [facility] in October of 2011 with a reduced schedule.    If you have any questions, or would like to request an appointment with one of our physicians, please call us at [phone] and press the option for Scheduling an appointment.  We take pleasure in providing you with excellent patient care and look forward to seeing you at your next office visit.  Our [facility] Physicians are:  Tillman Abide, M.D. Laurita Quint, M.D. Roxy Manns, M.D. Kerby Nora, M.D. Hannah Beat, M.D. Ruthe Mannan, M.D. We proudly welcomed Raechel Ache, M.D. and Eustaquio Boyden, M.D. to the practice in July/August 2011.  Sincerely,  [facility]

## 2010-06-10 NOTE — Assessment & Plan Note (Signed)
Summary: BAD COUGH AND CONGESTION / LFW   Vital Signs:  Patient profile:   54 year old male Height:      69.25 inches Weight:      280.75 pounds BMI:     41.31 Temp:     98.5 degrees F oral Pulse rate:   80 / minute Pulse rhythm:   regular BP sitting:   140 / 88  (left arm) Cuff size:   large  Vitals Entered By: Lewanda Rife (Sep 14, 2008 11:50 AM)  Primary Care Marika Mahaffy:  Shaune Leeks MD   History of Present Illness: Chief complaint: bad cough and congestion  Here for cough--onset x 5d, flu signs, no work first 2d --cough not improved, keeps awake at night --took vicodin 2 nights ago--helped--was left over form other Rx, mucinex, robitussinDM--no help, IBP 800 once daily   Allergies: 1)  ! * Codeine  Review of Systems      See HPI  Physical Exam  General:  alert, well-developed, well-nourished, and well-hydrated.   Ears:  TMs retracted with fluid bilat Nose:  no airflow obstruction, mucosal erythema, and mucosal edema.  sinuses neg Mouth:  no exudates and pharyngeal erythema.   Lungs:  frequent moist cough, no crackles and no wheezes.   Cervical Nodes:  no anterior cervical adenopathy and no posterior cervical adenopathy.   Psych:  normally interactive and good eye contact.     Impression & Recommendations:  Problem # 1:  COUGH (ICD-786.2) Assessment New suspect cough is residual from recent viral URI will use hycodan at hs, suggested Delsym for daytime continue comfort care measures: increase po fluids, rest, tylenol or IBP as needed see back in 5-7d if not improved   Complete Medication List: 1)  Soma 350 Mg Tabs (Carisoprodol) .Marland Kitchen.. 1 by mouth four times a day as needed 2)  Viagra Tabs (Sildenafil citrate tabs) .... Prn 3)  Ibuprofen 800 Mg Tabs (Ibuprofen) .Marland Kitchen.. 1 tablet q 6 hours as needed pain 4)  Hycodan  .Marland Kitchen.. 1 tsp at bedtime as needed cough, may repeat in 4-6 h as needed Prescriptions: HYCODAN 1 tsp at bedtime as needed cough, may repeat in 4-6  h as needed  #144ml x 0   Entered and Authorized by:   Gildardo Griffes FNP   Signed by:   Gildardo Griffes FNP on 09/14/2008   Method used:   Print then Give to Patient   RxID:   [id number]     Current Allergies (reviewed today): ! * CODEINE

## 2010-06-10 NOTE — Assessment & Plan Note (Signed)
Summary: FLU SHOT/CLE  Nurse Visit   Allergies: 1)  ! * Codeine  Immunizations Administered:  Influenza Vaccine # 1:    Vaccine Type: Fluvax 3+    Site: left deltoid    Mfr: GlaxoSmithKline    Dose: 0.5 ml    Route: IM    Given by: Mervin Hack CMA (AAMA)    Exp. Date: 11/07/2009    Lot #: [id number]    VIS given: 12/02/06 version given February 21, 2009.  Flu Vaccine Consent Questions:    Do you have a history of severe allergic reactions to this vaccine? no    Any prior history of allergic reactions to egg and/or gelatin? no    Do you have a sensitivity to the preservative Thimersol? no    Do you have a past history of Guillan-Barre Syndrome? no    Do you currently have an acute febrile illness? no    Have you ever had a severe reaction to latex? no    Vaccine information given and explained to patient? yes  Orders Added: 1)  Flu Vaccine 48yrs + [90658] 2)  Admin 1st Vaccine [86578]

## 2010-06-10 NOTE — Assessment & Plan Note (Signed)
Summary: LEFT HIP PAIN / LFW   Vital Signs:  Patient profile:   55 year old male Weight:      283.50 pounds Temp:     98.7 degrees F oral Pulse rate:   76 / minute Pulse rhythm:   regular BP sitting:   140 / 88  (left arm) Cuff size:   large  Vitals Entered By: Sydell Axon LPN (Oct 01, 2009 3:47 PM) CC: Left hip pain   History of Present Illness: Pt here for what he calls left hip pain but he actually points to the ileosacral area of the lower back and radiation into the sciatic notch area. He does not remember any inciting activity or incident. This has been bothering him for a week or more.  Problems Prior to Update: 1)  Unspecified Vitamin D Deficiency  (ICD-268.9) 2)  Hypertension  (ICD-401.9) 3)  Obesity  (ICD-278.00) 4)  Special Screening Malig Neoplasms Other Sites  (ICD-V76.49) 5)  Agitation  (ICD-307.9) 6)  Neoplasm of Uncertain Behavior of Skin  (ICD-238.2) 7)  Neck Spasm  (ICD-847.0) 8)  Health Maintenance Exam  (ICD-V70.0) 9)  Screening For Malignant Neoplasm, Prostate  (ICD-V76.44) 10)  Sleep Apnea, Obstructive, Mild  (ICD-327.23) 11)  Erectile Dysfunction  (ICD-302.72) 12)  Hypercholesterolemia, Mild 202/trig 217  (ICD-272.0) 13)  Nicotine Addiction  (ICD-305.1) 14)  Knee Pain, Right  (ICD-719.46)  Medications Prior to Update: 1)  Soma 350 Mg  Tabs (Carisoprodol) .Marland Kitchen.. 1 By Mouth Four Times A Day As Needed 2)  Viagra   Tabs (Sildenafil Citrate Tabs) .... Take One By Mouth As Directed As Needed 3)  Ibuprofen 800 Mg  Tabs (Ibuprofen) .Marland Kitchen.. 1 Tablet Every 6 Hours As Needed Pain 4)  Vicodin 5-500 Mg Tabs (Hydrocodone-Acetaminophen) .Marland Kitchen.. 1 Every 6 Hrs As Needed Pain 5)  Metoprolol Succinate 25 Mg Xr24h-Tab (Metoprolol Succinate) .... One Tab By Mouth At Night. 6)  Vitamin D 1000 Iu .... One Daily 7)  Amlodipine Besylate 5 Mg Tabs (Amlodipine Besylate) .... One Tab By Mouth At Night.  Allergies: 1)  ! * Codeine  Physical Exam  Msk:  Lower back, area of  discomfort actually just medial to the SI joint area on the left. Provacative maneuvers bother him slightly. No swelling or inflammation noted. No overt tenderness noted.   Impression & Recommendations:  Problem # 1:  PAIN IN L SI JOINT (ICD-719.45) Assessment New  Discussed approach. Shown stretches to do. Apply heat and ice as directed. His updated medication list for this problem includes:    Soma 350 Mg Tabs (Carisoprodol) .Marland Kitchen... 1 by mouth four times a day as needed    Ibuprofen 800 Mg Tabs (Ibuprofen) .Marland Kitchen... 1 tablet every 6 hours as needed pain    Vicodin 5-500 Mg Tabs (Hydrocodone-acetaminophen) .Marland Kitchen... 1 every 6 hrs as needed pain  Discussed use of medications, application of heat or cold, and exercises.   Complete Medication List: 1)  Soma 350 Mg Tabs (Carisoprodol) .Marland Kitchen.. 1 by mouth four times a day as needed 2)  Viagra Tabs (Sildenafil citrate tabs) .... Take one by mouth as directed as needed 3)  Ibuprofen 800 Mg Tabs (Ibuprofen) .Marland Kitchen.. 1 tablet every 6 hours as needed pain 4)  Vicodin 5-500 Mg Tabs (Hydrocodone-acetaminophen) .Marland Kitchen.. 1 every 6 hrs as needed pain 5)  Metoprolol Succinate 25 Mg Xr24h-tab (Metoprolol succinate) .... One tab by mouth at night. 6)  Vitamin D 1000 Iu  .... One daily 7)  Amlodipine Besylate 5 Mg Tabs (Amlodipine  besylate) .... One tab by mouth at night.  Patient Instructions: 1)  RTC if discomfort continues.  Current Allergies (reviewed today): ! * CODEINE

## 2010-06-10 NOTE — Assessment & Plan Note (Signed)
Summary: 11:15  COUGH/CLE   Vital Signs:  Patient Profile:   55 Years Old Male Height:     69.50 inches Weight:      264 pounds Temp:     99.4 degrees F oral Pulse rate:   68 / minute Pulse rhythm:   regular BP sitting:   120 / 90  (left arm) Cuff size:   large  Vitals Entered ByProvidence Crosby (Oct 05, 2007 11:07 AM)                 Chief Complaint:  COUGH// ? PRESCRIBTION FOR CHANTIX.  History of Present Illness: Here for cough, worse at night. Thinks his congestion is better, improved from when she saw Dr Ermalene Searing. Was initially put on guaif-cod which he couln't tol so was switched to [place],  which worked fine. Cough at night signif for production, clear.  He also is ready to quit smoking. Would like  to try Chantix as his wife is ready to quit and was given Chantix.    Prior Medications Reviewed Using: Patient Recall  Current Allergies (reviewed today): ! * CODEINE      Physical Exam  General:     Well-developed,well-nourished,in no acute distress; alert,appropriate and cooperative throughout examination, obese. Head:     Normocephalic and atraumatic without obvious abnormalities. No apparent alopecia or balding. Eyes:     Conjunctiva clear bilaterally.  Ears:     External ear exam shows no significant lesions or deformities.  Otoscopic examination reveals clear canals, tympanic membranes are intact bilaterally without bulging, retraction, inflammation or discharge. Hearing is grossly normal bilaterally. Nose:     External nasal examination shows no deformity or inflammation. Nasal mucosa are pink and moist without lesions or exudates. Mouth:     Oral mucosa and oropharynx without lesions or exudates.  Teeth in good repair. Neck:     No deformities, masses, or tenderness noted. Chest Wall:     No deformities, masses, tenderness or gynecomastia noted. Lungs:     Normal respiratory effort, chest expands symmetrically. Lungs are clear to auscultation, no  crackles or wheezes. Mild ronchi mid right lung field, cleared with cough. Heart:     Normal rate and regular rhythm. S1 and S2 normal without gallop, murmur, click, rub or other extra sounds. Abdomen:     Bowel sounds positive,abdomen soft and non-tender without masses, organomegaly or hernias noted.    Impression & Recommendations:  Problem # 1:  URI (ICD-465.9) Assessment: Unchanged See instructions. The following medications were removed from the medication list:    Hycodan 5-1.5 Mg/64ml Syrp (Hydrocodone-homatropine) .Marland Kitchen... 1 tsp by mouth qhs as needed cough  His updated medication list for this problem includes:    Ibuprofen 800 Mg Tabs (Ibuprofen) .Marland Kitchen... 1 tablet q 6 hours as needed pain    Hycodan 5-1.5 Mg/35ml Syrp (Hydrocodone-homatropine) ..... One tsppo at night   Problem # 2:  COUGH (ICD-786.2) Assessment: Unchanged See instructions, stop smoking.  Problem # 3:  NICOTINE ADDICTION (ICD-305.1) Assessment: Unchanged Wants to stop, warned about weight gain and need to consider diet control. His updated medication list for this problem includes:    Chantix Starting Month Pak 0.5 Mg X 11 & 1 Mg X 42 Misc (Varenicline tartrate) .Marland Kitchen... As dir    Chantix Continuing Month Pak 1 Mg Tabs (Varenicline tartrate) .Marland Kitchen... As dir Encouraged smoking cessation and discussed different methods for smoking cessation.   Problem # 4:  NECK SPASM (ICD-847.0) Assessment: Unchanged Cont SOMA as  needed, sparingly...we discussed habituation. His updated medication list for this problem includes:    Soma 350 Mg Tabs (Carisoprodol) .Marland Kitchen... 1 by mouth four times a day as needed    Ibuprofen 800 Mg Tabs (Ibuprofen) .Marland Kitchen... 1 tablet q 6 hours as needed pain Discussed exercises and use of moist heat or cold and medication.   Complete Medication List: 1)  Soma 350 Mg Tabs (Carisoprodol) .Marland Kitchen.. 1 by mouth four times a day as needed 2)  Viagra Tabs (Sildenafil citrate tabs) .... Prn 3)  Ibuprofen 800 Mg Tabs  (Ibuprofen) .Marland Kitchen.. 1 tablet q 6 hours as needed pain 4)  Chantix Starting Month Pak 0.5 Mg X 11 & 1 Mg X 42 Misc (Varenicline tartrate) .... As dir 5)  Chantix Continuing Month Pak 1 Mg Tabs (Varenicline tartrate) .... As dir 6)  Hycodan 5-1.5 Mg/79ml Syrp (Hydrocodone-homatropine) .... One tsppo at night   Patient Instructions: 1)  FOR congestion, take: 2)  GUAIFENESIN  600mg  by mouth AM and NOON   3)    [facility] MUCOUS RELIEF EXPECTORANT (400 mg) 11/2 TABS    4)  Drink Lots of Fluids.                      Prescriptions: HYCODAN 5-1.5 MG/5ML  SYRP (HYDROCODONE-HOMATROPINE) one tsppo at night  #8oz x 0   Entered and Authorized by:   Shaune Leeks MD   Signed by:   Shaune Leeks MD on 10/05/2007   Method used:   Print then Give to Patient   RxID:   [id number] CHANTIX CONTINUING MONTH PAK 1 MG  TABS (VARENICLINE TARTRATE) as dir  #1 x 4   Entered and Authorized by:   Shaune Leeks MD   Signed by:   Shaune Leeks MD on 10/05/2007   Method used:   Print then Give to Patient   RxID:   [id number] CHANTIX STARTING MONTH PAK 0.5 MG X 11 & 1 MG X 42  MISC (VARENICLINE TARTRATE) as dir  #i pak x 0   Entered and Authorized by:   Shaune Leeks MD   Signed by:   Shaune Leeks MD on 10/05/2007   Method used:   Print then Give to Patient   RxID:   [id number] SOMA 350 MG  TABS (CARISOPRODOL) 1 by mouth four times a day as needed  #120 x 2   Entered and Authorized by:   Shaune Leeks MD   Signed by:   Shaune Leeks MD on 10/05/2007   Method used:   Print then Give to Patient   RxID:   [id number]  ]

## 2010-06-10 NOTE — Progress Notes (Signed)
Summary: refill request for vicodin  Phone Note Refill Request Message from:  Fax from Pharmacy  Refills Requested: Medication #1:  VICODIN 5-500 MG TABS 1 every 6 hrs as needed pain   Last Refilled: 02/27/2010 Faxed request from [address], [phone].  Initial call taken by: Lowella Petties CMA, AAMA,  March 25, 2010 8:52 AM  Follow-up for Phone Call        Rx phoned to pharmacy.  Follow-up by: Melody Comas,  March 26, 2010 1:49 PM    Prescriptions: VICODIN 5-500 MG TABS (HYDROCODONE-ACETAMINOPHEN) 1 every 6 hrs as needed pain  #40 x 2   Entered and Authorized by:   Shaune Leeks MD   Signed by:   Shaune Leeks MD on 03/26/2010   Method used:   Telephoned to ...       [facility] [address] (retail)       [address]       [place], [place]  [address]       Ph: [phone]       Fax: [phone]   RxID:   [id number]

## 2010-06-10 NOTE — Progress Notes (Signed)
Summary: RX Ibuprofen  Phone Note Refill Request Call back at [phone] Message from:  [facility] on February 27, 2009 8:04 AM  Refills Requested: Medication #1:  IBUPROFEN 800 MG  TABS 1 tablet q 6 hours as needed pain   Last Refilled: 12/15/2008 Received e-scribe refill request.   Method Requested: Electronic Initial call taken by: Sydell Axon LPN,  February 27, 2009 8:05 AM    Prescriptions: IBUPROFEN 800 MG  TABS (IBUPROFEN) 1 tablet q 6 hours as needed pain  #60 Each x 1   Entered and Authorized by:   Shaune Leeks MD   Signed by:   Shaune Leeks MD on 02/27/2009   Method used:   Electronically to        [facility] [address] (retail)       [address]       [place], [place]  [address]       Ph: [phone]       Fax: [phone]   RxID:   [id number]

## 2010-06-10 NOTE — Progress Notes (Signed)
Summary: pt wants new CPAP machine  Phone Note Call from Patient Call back at [phone] wife, [name]   Caller: Patient Summary of Call: Pt is asking if you can prescribe a new CPAP machine.  He doesnt want to go back to pulmonologist if he doesnt have to, it will be a lower co-pay if he comes here. Initial call taken by: Lowella Petties CMA,  February 10, 2010 5:02 PM  Follow-up for Phone Call        Please call the pulm clinic.  If they need to see hiim back, then have him follow up with them.  If they don't need to see him, then let me know what maching/mask he has and his current settings.  I could then write the rx.  Follow-up by: Crawford Givens MD,  February 10, 2010 8:27 PM  Additional Follow-up for Phone Call Additional follow up Details #1::        I phoned Pulmonary, spoke with Chantelle.  She says he has an appt. with them on 10/6 at 2:15 p.m.  Ordering the CPAP would not be an issue but he has not seen the sleep MD in over a year so he would need to be evaluated.  Pulmonary's phone message stated that he had never started the CPAP because of cost issues. Additional Follow-up by: Delilah Shan CMA Juan Thompson),  February 11, 2010 10:25 AM    Additional Follow-up for Phone Call Additional follow up Details #2::    Please tell patient that he needs eval with pulm clinic and I would like them to order the CPAP as needed.  Follow-up by: Crawford Givens MD,  February 11, 2010 10:40 AM  Additional Follow-up for Phone Call Additional follow up Details #3:: Details for Additional Follow-up Action Taken: Patient Advised.  Additional Follow-up by: Delilah Shan CMA (AAMA),  February 11, 2010 11:04 AM

## 2010-06-10 NOTE — Assessment & Plan Note (Signed)
Summary: tick bite/dlo   Vital Signs:  Patient profile:   55 year old male Height:      69.25 inches Weight:      279 pounds BMI:     41.05 Temp:     98.3 degrees F oral Pulse rate:   76 / minute Pulse rhythm:   regular BP sitting:   140 / 100  (left arm) Cuff size:   large  Vitals Entered By: Providence Crosby LPN (January 07, 2009 10:26 AM) CC: discuss vitamin d level and tick bite left leg   History of Present Illness: Here for tick bite of small whitish tan tick he pulled off Sat night from his posterior proximal left shin that was on more than 24 hrs and was engorged. He now has redness and mild swelling surrounding the bite itaself which has been draining.  His weight is no better and his BP continues to run high.  He has no complaints and feels well. He denies fever or chills, no nausea. His leg is slightly sore and he has noticed redness and swelling.  Problems Prior to Update: 1)  Special Screening Malig Neoplasms Other Sites  (ICD-V76.49) 2)  Agitation  (ICD-307.9) 3)  Neoplasm of Uncertain Behavior of Skin  (ICD-238.2) 4)  Neck Spasm  (ICD-847.0) 5)  Health Maintenance Exam  (ICD-V70.0) 6)  Screening For Malignant Neoplasm, Prostate  (ICD-V76.44) 7)  Sleep Apnea, Obstructive, Mild  (ICD-327.23) 8)  Erectile Dysfunction  (ICD-302.72) 9)  Hypercholesterolemia, Mild 202/trig 217  (ICD-272.0) 10)  Nicotine Addiction  (ICD-305.1) 11)  Knee Pain, Right  (ICD-719.46)  Medications Prior to Update: 1)  Soma 350 Mg  Tabs (Carisoprodol) .Marland Kitchen.. 1 By Mouth Four Times A Day As Needed 2)  Viagra   Tabs (Sildenafil Citrate Tabs) .... Prn 3)  Ibuprofen 800 Mg  Tabs (Ibuprofen) .Marland Kitchen.. 1 Tablet Q 6 Hours As Needed Pain 4)  Hycodan .Marland Kitchen.. 1 Tsp At Bedtime As Needed Cough, May Repeat in 4-6 H As Needed 5)  Vicodin 5-500 Mg Tabs (Hydrocodone-Acetaminophen) .Marland Kitchen.. 1 Every 6 Hrs As Needed Pain  Allergies: 1)  ! * Codeine  Family History: Father: A 66   CAD Stent Skin Ca Mother: A 11  HTN Thyroid  dz Stent BrotherA 58 HTN Brother A 54 CV - none HBP + Bro, mother Cancer - none Depression - none Strokes - none ETOH/Drug abuse - none GM has Parkinson's disease Uncle has diabetes  Physical Exam  General:  alert, well-developed, well-nourished, and well-hydrated.   Head:  Normocephalic and atraumatic without obvious abnormalities. No apparent alopecia or balding. Sinuses nontender. Eyes:  PERRLA and EOMI.   Ears:  TMs retracted with fluid bilat Nose:  no airflow obstruction, mucosal erythema, and mucosal edema.  sinuses neg Mouth:  no exudates and pharyngeal erythema.   Extremities:  L lower leg just below the popliteal space, inflamed papule/bite mark of prior tick with surrounding faint erythema, no overt streaking noted.   Impression & Recommendations:  Problem # 1:  HYPERTENSION (ICD-401.9) Assessment New  Will finally diagnose, pt unable to lose weight which would probably normalize. Srtart Metoprolol. His updated medication list for this problem includes:    Metoprolol Succinate 25 Mg Xr24h-tab (Metoprolol succinate) ..... One tab by mouth at night.  BP today: 140/100 Prior BP: 140/88 (09/14/2008)  Labs Reviewed: K+: 4.5 (12/07/2007) Creat: : 1.1 (12/07/2007)   Chol: 196 (12/07/2007)   HDL: 29.9 (12/07/2007)   LDL: DEL (12/07/2007)   TG: 233 (12/07/2007)  Problem # 2:  OBESITY (ICD-278.00) Assessment: Unchanged  Needs to lose. Needs to exert some control over intake. Discussed at length, again.  Ht: 69.25 (01/07/2009)   Wt: 279 (01/07/2009)   BMI: 41.05 (01/07/2009)  Problem # 3:  TICK BITE (ICD-E906.4) Assessment: New Discussed S/S of Lyme Dz.  Problem # 4:  CELLULITIS, LEG, LEFT (ICD-682.6) Assessment: New  Looks to be infected. Will start Doxy for cellulitis, MRSA and poss Lyme Dz. His updated medication list for this problem includes:    Doxycycline Hyclate 100 Mg Caps (Doxycycline hyclate) ..... One tab by mouth two times a day Doxy called  in.  Elevate affected area. Warm moist compresses for 20 minutes every 2 hours while awake. Take antibiotics as directed and take acetaminophen as needed. To be seen in 48-72 hours if no improvement, sooner if worse.  Complete Medication List: 1)  Soma 350 Mg Tabs (Carisoprodol) .Marland Kitchen.. 1 by mouth four times a day as needed 2)  Viagra Tabs (Sildenafil citrate tabs) .... Prn 3)  Ibuprofen 800 Mg Tabs (Ibuprofen) .Marland Kitchen.. 1 tablet q 6 hours as needed pain 4)  Hycodan  .Marland Kitchen.. 1 tsp at bedtime as needed cough, may repeat in 4-6 h as needed 5)  Vicodin 5-500 Mg Tabs (Hydrocodone-acetaminophen) .Marland Kitchen.. 1 every 6 hrs as needed pain 6)  Doxycycline Hyclate 100 Mg Caps (Doxycycline hyclate) .... One tab by mouth two times a day 7)  Metoprolol Succinate 25 Mg Xr24h-tab (Metoprolol succinate) .... One tab by mouth at night.  Patient Instructions: 1)  Take Vit 1000Iu twice a day. 2)  Rtc 2/1/2 mos for recheck. Vit D prior 268.9 Prescriptions: DOXYCYCLINE HYCLATE 100 MG CAPS (DOXYCYCLINE HYCLATE) one tab by mouth two times a day  #20 x 0   Entered and Authorized by:   Shaune Leeks MD   Signed by:   Shaune Leeks MD on 01/07/2009   Method used:   Electronically to        [facility] [address] (retail)       [address]       [place], [place]  [address]       Ph: [phone]       Fax: [phone]   RxID:   [id number] METOPROLOL SUCCINATE 25 MG XR24H-TAB (METOPROLOL SUCCINATE) one tab by mouth at night.  #30 x 12   Entered and Authorized by:   Shaune Leeks MD   Signed by:   Shaune Leeks MD on 01/07/2009   Method used:   Electronically to        [facility] [address] (retail)       [address], [address]       [place], [place]  [address]       Ph: [phone]       Fax: [phone]   RxID:   [id number] KEFLEX 500 MG CAPS (CEPHALEXIN) one tab by mouth 4 times a day.  #40 x 0   Entered and  Authorized by:   Shaune Leeks MD   Signed by:   Shaune Leeks MD on 01/07/2009   Method used:   Electronically to        [facility] [address] (retail)       [address]       [place], [place]  [address]       Ph: [phone]       Fax: [phone]   RxID:   [id number]

## 2010-06-10 NOTE — Assessment & Plan Note (Signed)
Summary: rov for osa   Visit Type:  Follow-up Copy to:  Laurita Quint Primary Ahley Bulls/Referring Dhyan Noah:  Shaune Leeks MD  CC:  OSA follow-up...patient never purchased cpap due to expense.  History of Present Illness: the pt is a 55y/o male with h/o mild osa, who comes in today for f/u of his sleep apnea.  I saw him initially in Jan 2010 as a new consult, and the decision was made to start on cpap due to ongoing symptoms.  The pt never followed thru on this due to the expense, and never followed up or called to let me know.  He comes in today where he now has the ability to afford a cpap machine, and wishes to start on treatment.  He continues to have similar symptoms as before.  His weight is up about 4 pounds from the last visit.  Current Medications (verified): 1)  Soma 350 Mg  Tabs (Carisoprodol) .Marland Kitchen.. 1 By Mouth Four Times A Day As Needed 2)  Viagra   Tabs (Sildenafil Citrate Tabs) .... Take One By Mouth As Directed As Needed 3)  Ibuprofen 800 Mg  Tabs (Ibuprofen) .Marland Kitchen.. 1 Tablet Every 6 Hours As Needed Pain 4)  Vicodin 5-500 Mg Tabs (Hydrocodone-Acetaminophen) .Marland Kitchen.. 1 Every 6 Hrs As Needed Pain 5)  Metoprolol Succinate 25 Mg Xr24h-Tab (Metoprolol Succinate) .... One Tab By Mouth At Night. 6)  Amlodipine Besylate 5 Mg Tabs (Amlodipine Besylate) .... One Tab By Mouth At Night.  Allergies (verified): 1)  ! * Codeine  Review of Systems  The patient denies shortness of breath with activity, shortness of breath at rest, productive cough, non-productive cough, coughing up blood, chest pain, irregular heartbeats, acid heartburn, indigestion, loss of appetite, weight change, abdominal pain, difficulty swallowing, sore throat, tooth/dental problems, headaches, nasal congestion/difficulty breathing through nose, sneezing, itching, ear ache, anxiety, depression, hand/feet swelling, joint stiffness or pain, rash, change in color of mucus, and fever.    Vital Signs:  Patient profile:    54 year old male Height:      69.25 inches (175.90 cm) Weight:      291 pounds (132.27 kg) BMI:     42.82 O2 Sat:      97 % on Room air Temp:     98.3 degrees F (36.83 degrees C) oral Pulse rate:   95 / minute BP sitting:   140 / 82  (left arm) Cuff size:   large  Vitals Entered By: Michel Bickers CMA (February 13, 2010 2:27 PM)  O2 Sat at Rest %:  97 O2 Flow:  Room air CC: OSA follow-up...patient never purchased cpap due to expense Comments Medications reviewed with patient Daytime phone verified. Michel Bickers CMA  February 13, 2010 2:28 PM   Physical Exam  General:  obese male in nad Nose:  turbinate hypertrophy with narrowing Mouth:  elongation of soft palate and uvula Extremities:  no edema or cyanosis  Neurologic:  alert and oriented, does not appear sleepy, moves all 4.   Impression & Recommendations:  Problem # 1:  SLEEP APNEA, OBSTRUCTIVE, MILD (ICD-327.23) the pt has mild osa by his last sleep study, and now wishes to try cpap.  I have also reviewed the other treatment options with him, including weight loss, upper airway surgery, and dental appliance.  I suspect the latter would work very well for him, but he wishes to try cpap first.  I have offered to set him up on cpap thru a DME company, but  he is considering purchasing the machine off the internet (has a high deductible plan).  I have reviewed the various benefits and downsides to doing this with him, but ultimately this is his decision.  I will set the patient up on cpap at a moderate pressure level to allow for desensitization, and will troubleshoot the device over the next 4-6weeks if needed.  The pt is to call me if having issues with tolerance.  Will then optimize the pressure once patient is able to wear cpap on a consistent basis.  Other Orders: Est. Patient Level III ([address]) DME Referral (DME)  Patient Instructions: 1)  will give you prescription for cpap machine and mask...you can decide how you want to get  the cpap. 2)  work on weight loss 3)  followup with me in 4 weeks after you have been on the machine.

## 2010-06-10 NOTE — Progress Notes (Signed)
Summary: Rx Vicodin  Phone Note Refill Request Call back at [phone] Message from:  [facility] on August 15, 2009 5:17 PM  Refills Requested: Medication #1:  VICODIN 5-500 MG TABS 1 every 6 hrs as needed pain   Last Refilled: 06/28/2008  Method Requested: Telephone to Pharmacy Initial call taken by: Sydell Axon LPN,  August 15, 2009 5:17 PM  Follow-up for Phone Call        Rx called to pharmacy Follow-up by: Sydell Axon LPN,  August 15, 2009 5:22 PM    Prescriptions: VICODIN 5-500 MG TABS (HYDROCODONE-ACETAMINOPHEN) 1 every 6 hrs as needed pain  #40 x 0   Entered and Authorized by:   Shaune Leeks MD   Signed by:   Shaune Leeks MD on 08/15/2009   Method used:   Telephoned to ...       [facility] [address] (retail)       [address], [address]       [place], [place]  [address]       Ph: [phone]       Fax: [phone]   RxID:   [id number]

## 2010-06-10 NOTE — Letter (Signed)
Summary: Results Follow up Letter   at [facility]  [address] [place], [place] [address]   Phone: [phone]  Fax: [phone]    12/08/2007 MRN: [id number]  Juan Thompson [address] [place], [place]  [address]  Dear Mr. Dombeck,  The following are the results of your recent test(s):  Test         Result    Pap Smear:        Normal _____  Not Normal _____ Comments: ______________________________________________________ Cholesterol: LDL(Bad cholesterol):         Your goal is less than:         HDL (Good cholesterol):       Your goal is more than: Comments:  ______________________________________________________ Mammogram:        Normal _____  Not Normal _____ Comments:  ___________________________________________________________________ Hemoccult:        Normal _____  Not normal _______ Comments:    _____________________________________________________________________ Other Tests:   WILL DISCUSS WITH YOUR PHYSICAL. NOTHING DANGEROUSLY ABNORMAL.    We routinely do not discuss normal results over the telephone.  If you desire a copy of the results, or you have any questions about this information we can discuss them at your next office visit.   Sincerely,

## 2010-06-10 NOTE — Progress Notes (Signed)
Summary: Vicodin  Phone Note Refill Request Message from:  [employer] on March 08, 2009 2:14 PM  Refills Requested: Medication #1:  VICODIN 5-500 MG TABS 1 every 6 hrs as needed pain [place], [address]  Phone:   [phone]   Method Requested: Telephone to Pharmacy Initial call taken by: Delilah Shan CMA Duncan Dull),  March 08, 2009 2:15 PM  Follow-up for Phone Call        Rx called to pharmacy Follow-up by: Sydell Axon LPN,  March 11, 2009 9:54 AM    Prescriptions: VICODIN 5-500 MG TABS (HYDROCODONE-ACETAMINOPHEN) 1 every 6 hrs as needed pain  #40 x 0   Entered and Authorized by:   Shaune Leeks MD   Signed by:   Shaune Leeks MD on 03/10/2009   Method used:   Telephoned to ...       [facility] [address]       [address]       [place], [place]  [address]       Ph: [phone]       Fax: [phone]   RxID:   [id number]

## 2010-06-10 NOTE — Progress Notes (Signed)
Summary: Rx Vicodin  Phone Note Refill Request Message from:  [employer] on May 23, 2009 8:09 AM  Refills Requested: Medication #1:  VICODIN 5-500 MG TABS 1 every 6 hrs as needed pain   Last Refilled: 04/12/2009 Received e-script request for refill, please advise   Method Requested: Fax to Local Pharmacy Initial call taken by: Linde Gillis CMA Duncan Dull),  May 23, 2009 8:10 AM  Follow-up for Phone Call        On my desk for pick up. Follow-up by: Ruthe Mannan MD,  May 23, 2009 8:11 AM  Additional Follow-up for Phone Call Additional follow up Details #1::        Medication phoned to pharmacy.   [facility] Additional Follow-up by: Delilah Shan CMA Duncan Dull),  May 23, 2009 10:37 AM    Prescriptions: VICODIN 5-500 MG TABS (HYDROCODONE-ACETAMINOPHEN) 1 every 6 hrs as needed pain  #40 x 0   Entered by:   Ruthe Mannan MD   Authorized by:   Shaune Leeks MD   Signed by:   Ruthe Mannan MD on 05/23/2009   Method used:   Print then Give to Patient   RxID:   [id number]

## 2010-06-10 NOTE — Progress Notes (Signed)
Summary: refill request for vicodin  Phone Note Refill Request Message from:  [facility]  Refills Requested: Medication #1:  VICODIN 5-500 MG TABS 1 every 6 hrs as needed pain   Last Refilled: 03/11/2009 [facility]ill [facility] [address].  Initial call taken by: Lowella Petties CMA,  April 12, 2009 10:50 AM  Follow-up for Phone Call        Rx refilled  Billie-Lynn Tyler Deis FNP  April 12, 2009 1:38 PM   Medication phoned to [facility] [address] pharmacy as instructed.  Follow-up by: Lewanda Rife LPN,  April 12, 2009 3:40 PM    Prescriptions: VICODIN 5-500 MG TABS (HYDROCODONE-ACETAMINOPHEN) 1 every 6 hrs as needed pain  #40 x 0   Entered and Authorized by:   Gildardo Griffes FNP   Signed by:   Gildardo Griffes FNP on 04/12/2009   Method used:   Telephoned to ...       [facility] [address]ail)       [address]       [place], [place]  [address]       Ph: [phone]       Fax: [phone]   RxID:   [id number]

## 2010-06-10 NOTE — Assessment & Plan Note (Signed)
Summary: KNEE INJURY/HEA   Vital Signs:  Patient Profile:   55 Years Old Male Weight:      261 pounds Temp:     98.6 degrees F oral Pulse rate:   87 / minute BP sitting:   146 / 104  (left arm) Cuff size:   large  Vitals Entered By: Cooper Render (November 15, 2006 9:35 AM)               Chief Complaint:  R) knee injury.  History of Present Illness: Here due to injury R knee 11/10/06--twisting.  Iced after occured. Taking IBP 800 q8h with no help.  Worse at night, sleeping in recliner, or moves there when not able to tolerate being in bed.  Worked 7/3--off since then.  Able to bend more today, but wearing brace --wife's --feels that it helps.  Has problems with this knee before this occurance.  Has never seen Ortho.  Current Allergies (reviewed today): ! CODEINE Updated/Current Medications (including changes made in today's visit):  SOMA   TABS (CARISOPRODOL TABS) prn BL IBUPROFEN   TABS (IBUPROFEN TABS) as needed VIAGRA   TABS (SILDENAFIL CITRATE TABS) prn VICODIN 5-500 MG  TABS (HYDROCODONE-ACETAMINOPHEN) 1 qhs      Review of Systems      See HPI   Physical Exam  General:     alert, well-developed, well-nourished, well-hydrated, and overweight-appearing.   Head:     normocephalic.   Eyes:     no injection.   Lungs:     normal respiratory effort.   Msk:     knee brace--ace type on.  effusion upper medial and tenderness along medial joint line.  pain at the end of extension and flexion.   Extremities:     no pretib edems Neurologic:     limps  Skin:     turgor normal and color normal.   Psych:     normally interactive.      Impression & Recommendations:  Problem # 1:  KNEE PAIN, RIGHT (ICD-719.46) Assessment: Unchanged  His updated medication list for this problem includes:    Soma Tabs (Carisoprodol tabs) .Marland Kitchen... Prn    Bl Ibuprofen Tabs (Ibuprofen tabs) .Marland Kitchen... As needed    Vicodin 5-500 Mg Tabs (Hydrocodone-acetaminophen) .Marland Kitchen... 1 at bedtime--added  today  Orders: Orthopedic Surgeon Referral (Ortho Surgeon)   Medications Added to Medication List This Visit: 1)  Soma Tabs (Carisoprodol tabs) .... Prn 2)  Bl Ibuprofen Tabs (Ibuprofen tabs) .... As needed 3)  Viagra Tabs (Sildenafil citrate tabs) .... Prn 4)  Vicodin 5-500 Mg Tabs (Hydrocodone-acetaminophen) .Marland Kitchen.. 1 qhs   Patient Instructions: 1)  refer to Ortho re. R knee pain---he will call with name of Ortho he wants to see    Prescriptions: VICODIN 5-500 MG  TABS (HYDROCODONE-ACETAMINOPHEN) 1 qhs  #20 x 0   Entered and Authorized by:   Gildardo Griffes FNP   Signed by:   Gildardo Griffes FNP on 11/15/2006   Method used:   Print then Give to Patient   RxID:   [id number]

## 2010-06-12 ENCOUNTER — Telehealth: Payer: Self-pay | Admitting: Family Medicine

## 2010-06-12 NOTE — Progress Notes (Signed)
Summary: refill request for vicodin  Phone Note Refill Request Call back at Home Phone [phone] Message from:  Patient  Refills Requested: Medication #1:  VICODIN 5-500 MG TABS 1 every 6 hrs as needed pain Phoned request from pt.  Uses [facility] [address].  Initial call taken by: Lowella Petties CMA, AAMA,  May 07, 2010 8:26 AM  Follow-up for Phone Call        [facility] Follow-up by: Lowella Petties CMA, AAMA,  May 07, 2010 2:11 PM    Prescriptions: VICODIN 5-500 MG TABS (HYDROCODONE-ACETAMINOPHEN) 1 every 6 hrs as needed pain  #40 x 2   Entered and Authorized by:   Shaune Leeks MD   Signed by:   Shaune Leeks MD on 05/07/2010   Method used:   Telephoned to ...       [facility] [address])       [address], [address]       [place], [place]  [address]       Ph: [phone]       Fax: [phone]   RxID:   [id number]

## 2010-06-18 NOTE — Progress Notes (Signed)
Summary: vicodin  Phone Note Refill Request Message from:  Patient on June 12, 2010 4:41 PM  Refills Requested: Medication #1:  VICODIN 5-500 MG TABS 1 every 6 hrs as needed pain [facility]ses [facility] [address]. Paient says that he has contacted pharmacy, but we still have not recieved fax from them.   Initial call taken by: Melody Comas,  June 12, 2010 4:42 PM  Follow-up for Phone Call        Rx phoned to pharmacy. Follow-up by: Melody Comas,  June 12, 2010 4:55 PM    Prescriptions: VICODIN 5-500 MG TABS (HYDROCODONE-ACETAMINOPHEN) 1 every 6 hrs as needed pain  #40 x 2   Entered and Authorized by:   Shaune Leeks MD   Signed by:   Shaune Leeks MD on 06/12/2010   Method used:   Telephoned to ...       [facility] [address]       [address]       [place], [place]  [address]       Ph: [phone]       Fax: [phone]   RxID:   [id number]

## 2010-06-30 ENCOUNTER — Other Ambulatory Visit: Payer: Self-pay

## 2010-07-01 ENCOUNTER — Encounter (INDEPENDENT_AMBULATORY_CARE_PROVIDER_SITE_OTHER): Payer: Self-pay | Admitting: *Deleted

## 2010-07-01 ENCOUNTER — Other Ambulatory Visit (INDEPENDENT_AMBULATORY_CARE_PROVIDER_SITE_OTHER): Payer: 59

## 2010-07-01 ENCOUNTER — Encounter: Payer: Self-pay | Admitting: Family Medicine

## 2010-07-01 ENCOUNTER — Other Ambulatory Visit: Payer: Self-pay | Admitting: Family Medicine

## 2010-07-01 DIAGNOSIS — Z125 Encounter for screening for malignant neoplasm of prostate: Secondary | ICD-10-CM

## 2010-07-01 DIAGNOSIS — E559 Vitamin D deficiency, unspecified: Secondary | ICD-10-CM

## 2010-07-01 DIAGNOSIS — E7800 Pure hypercholesterolemia, unspecified: Secondary | ICD-10-CM

## 2010-07-01 DIAGNOSIS — I10 Essential (primary) hypertension: Secondary | ICD-10-CM

## 2010-07-01 DIAGNOSIS — F17200 Nicotine dependence, unspecified, uncomplicated: Secondary | ICD-10-CM

## 2010-07-01 DIAGNOSIS — E785 Hyperlipidemia, unspecified: Secondary | ICD-10-CM

## 2010-07-01 LAB — CBC WITH DIFFERENTIAL/PLATELET
Basophils Absolute: 0 10*3/uL (ref 0.0–0.1)
Basophils Relative: 0.5 % (ref 0.0–3.0)
Eosinophils Absolute: 0.1 10*3/uL (ref 0.0–0.7)
Eosinophils Relative: 1.8 % (ref 0.0–5.0)
HCT: 41.9 % (ref 39.0–52.0)
Hemoglobin: 14.2 g/dL (ref 13.0–17.0)
Lymphocytes Relative: 20.2 % (ref 12.0–46.0)
Lymphs Abs: 1.4 10*3/uL (ref 0.7–4.0)
MCHC: 34 g/dL (ref 30.0–36.0)
MCV: 86.4 fl (ref 78.0–100.0)
Monocytes Absolute: 0.6 10*3/uL (ref 0.1–1.0)
Monocytes Relative: 8.3 % (ref 3.0–12.0)
Neutro Abs: 4.9 10*3/uL (ref 1.4–7.7)
Neutrophils Relative %: 69.2 % (ref 43.0–77.0)
Platelets: 232 10*3/uL (ref 150.0–400.0)
RBC: 4.85 Mil/uL (ref 4.22–5.81)
RDW: 13.4 % (ref 11.5–14.6)
WBC: 7.1 10*3/uL (ref 4.5–10.5)

## 2010-07-01 LAB — HEPATIC FUNCTION PANEL
ALT: 32 U/L (ref 0–53)
AST: 25 U/L (ref 0–37)
Albumin: 4.2 g/dL (ref 3.5–5.2)
Alkaline Phosphatase: 65 U/L (ref 39–117)
Bilirubin, Direct: 0.1 mg/dL (ref 0.0–0.3)
Total Bilirubin: 0.6 mg/dL (ref 0.3–1.2)
Total Protein: 6.8 g/dL (ref 6.0–8.3)

## 2010-07-01 LAB — LIPID PANEL
Cholesterol: 195 mg/dL (ref 0–200)
HDL: 35.4 mg/dL — ABNORMAL LOW (ref >39.00)
Total CHOL/HDL Ratio: 6
Triglycerides: 211 mg/dL — ABNORMAL HIGH (ref 0.0–149.0)
VLDL: 42.2 mg/dL — ABNORMAL HIGH (ref 0.0–40.0)

## 2010-07-01 LAB — BASIC METABOLIC PANEL
BUN: 18 mg/dL (ref 6–23)
CO2: 30 mEq/L (ref 19–32)
Calcium: 9.2 mg/dL (ref 8.4–10.5)
Chloride: 106 mEq/L (ref 96–112)
Creatinine, Ser: 1.1 mg/dL (ref 0.4–1.5)
GFR: 76.75 mL/min (ref >60.00)
Glucose, Bld: 90 mg/dL (ref 70–99)
Potassium: 4.3 mEq/L (ref 3.5–5.1)
Sodium: 142 mEq/L (ref 135–145)

## 2010-07-01 LAB — TSH: TSH: 0.89 u[IU]/mL (ref 0.35–5.50)

## 2010-07-01 LAB — PSA: PSA: 0.61 ng/mL (ref 0.10–4.00)

## 2010-07-02 LAB — CONVERTED CEMR LAB: Vit D, 25-Hydroxy: 23 ng/mL — ABNORMAL LOW (ref 30–89)

## 2010-07-02 LAB — LDL CHOLESTEROL, DIRECT: Direct LDL: 138.9 mg/dL

## 2010-07-04 ENCOUNTER — Encounter: Payer: Self-pay | Admitting: Family Medicine

## 2010-07-04 ENCOUNTER — Encounter (INDEPENDENT_AMBULATORY_CARE_PROVIDER_SITE_OTHER): Payer: 59 | Admitting: Family Medicine

## 2010-07-04 DIAGNOSIS — Z0000 Encounter for general adult medical examination without abnormal findings: Secondary | ICD-10-CM

## 2010-07-04 DIAGNOSIS — I10 Essential (primary) hypertension: Secondary | ICD-10-CM

## 2010-07-04 DIAGNOSIS — N529 Male erectile dysfunction, unspecified: Secondary | ICD-10-CM

## 2010-07-04 DIAGNOSIS — Z23 Encounter for immunization: Secondary | ICD-10-CM

## 2010-07-08 NOTE — Assessment & Plan Note (Signed)
Summary: CPX/CLE   Vital Signs:  Patient profile:   55 year old male Height:      69.25 inches Weight:      286.25 pounds BMI:     42.12 Temp:     98.7 degrees F oral Pulse rate:   84 / minute Pulse rhythm:   regular BP sitting:   144 / 70  (left arm) Cuff size:   large  Vitals Entered By: Delilah Shan CMA Miley Lindon Dull) (July 04, 2010 11:34 AM) CC: CPX, Preventive Care   History of Present Illness: CPE: See plan.  ED.  Continues. treated w/o side effects, intolerance.  Doesn't need a refill now.   Hypertension:      Using medication without problems or lightheadedness: yes Chest pain with exertion: no Edema:no Short of breath:no Other issues: no  Labs reviewed with patient. d/w patient EA:VWUJ and exercise.   Allergies: 1)  ! * Codeine  Past History:  Past Medical History: HEALTH MAINTENANCE EXAM (ICD-V70.0) SCREENING FOR MALIGNANT NEOPLASM, PROSTATE (ICD-V76.44) SLEEP APNEA, OBSTRUCTIVE, MILD (ICD-327.23) HYPERCHOLESTEROLEMIA, MILD 202/TRIG 217 (ICD-272.0) KNEE PAIN (WJX-914.78)  Past Surgical History: therapeutic Nasal Fracture 1976 MRI Cerv. spine without DDD 4/5, bulg 5/6, narrowing 5/6 10/30/2002 Sleep study, mild obstruc. sleep apnea 04/17/05  Family History: Reviewed history from 12/13/2009 and no changes required. Father: alive CAD Stent, skin CA Mother: alive HTN Thyroid dz Stent BrotherA HTN Brother A  PGM with parkinson's Uncle with DM no FH prostate cancer.   Social History: Reviewed history from 04/01/2010 and no changes required. Occupation: Chartered certified accountant for [employer] and [employer] use-no quit smoking in  ~2010 with chantix alcohol occ on weekend, rare Married 1987 exercise: little  Review of Systems       See HPI.  Otherwise negative.    Physical Exam  General:  GEN: nad, alert and oriented HEENT: mucous membranes moist, TM w/o erythema, nasal epithelium not injected, OP wnl NECK: supple w/o LA CV: rrr. PULM: ctab, no inc  wob ABD: soft, +bs, obese EXT: no edema  Prostate:  Prostate gland firm and smooth, no enlargement, nodularity, tenderness, mass, asymmetry or induration.   Impression & Recommendations:  Problem # 1:  HEALTH MAINTENANCE EXAM (ICD-V70.0) healthy habits d/w patient, esp diet/exercise/weight.  PNA vaccine today.  See below.    Problem # 2:  SPECIAL SCREENING MALIG NEOPLASMS OTHER SITES (ICD-V76.49) elects for IFOB.  No FH colon CA.  This is reasonable for screening.  No blood known in stool.  We talked about options.   Problem # 3:  SCREENING FOR MALIGNANT NEOPLASM, PROSTATE (ICD-V76.44) He may not need PSA from this point forward.  Recs d/w patient.  No FH and normal exam.   Problem # 4:  ORGANIC IMPOTENCE (ICD-607.84) continue meds.  no change.  His updated medication list for this problem includes:    Viagra Tabs (Sildenafil citrate tabs) .Marland Kitchen... Take one by mouth as directed as needed  Problem # 5:  HYPERTENSION (ICD-401.9) He is working on getting OSA equipement.  D/w patient GN:FAOZ and exercise.  His updated medication list for this problem includes:    Amlodipine Besylate 5 Mg Tabs (Amlodipine besylate) ..... One tab by mouth at night.  Complete Medication List: 1)  Soma 350 Mg Tabs (Carisoprodol) .Marland Kitchen.. 1 by mouth four times a day as needed 2)  Viagra Tabs (Sildenafil citrate tabs) .... Take one by mouth as directed as needed 3)  Ibuprofen 800 Mg Tabs (Ibuprofen) .Marland Kitchen.. 1 tablet every 6 hours as needed  pain 4)  Vicodin 5-500 Mg Tabs (Hydrocodone-acetaminophen) .Marland Kitchen.. 1 every 6 hrs as needed pain 5)  Amlodipine Besylate 5 Mg Tabs (Amlodipine besylate) .... One tab by mouth at night.  Other Orders: Pneumococcal Vaccine (98119) Admin 1st Vaccine (14782)  PSA Screening:    PSA: 0.61  (07/01/2010)  Immunization & Chemoprophylaxis:    Tetanus vaccine: Tdap  (01/19/2005)    Influenza vaccine: Fluvax 3+  (02/19/2010)    Pneumovax: Pneumovax  (07/04/2010)  Patient  Instructions: 1)  Let me know when you are running low on meds.   2)  I would work on getting more exercise by walking.  Talk to Dr. Shelle Iron if you have trouble getting the equipment.  Take 1000 units of vitamin D a day.  3)  Take care.  Glad to see you.     Orders Added: 1)  Est. Patient 40-64 years [99396] 2)  Est. Patient Level III [99213] 3)  Pneumococcal Vaccine [90732] 4)  Admin 1st Vaccine [90471]   Immunizations Administered:  Pneumonia Vaccine:    Vaccine Type: Pneumovax    Site: left deltoid    Mfr: Merck    Dose: 0.5 ml    Route: IM    Given by: Delilah Shan CMA (AAMA)    Exp. Date: 10/03/2011    Lot #: [id number]    VIS given: 04/15/09 version given July 04, 2010.   Immunizations Administered:  Pneumonia Vaccine:    Vaccine Type: Pneumovax    Site: left deltoid    Mfr: Merck    Dose: 0.5 ml    Route: IM    Given by: Delilah Shan CMA (AAMA)    Exp. Date: 10/03/2011    Lot #: [id number]    VIS given: 04/15/09 version given July 04, 2010.  Current Allergies (reviewed today): ! * CODEINE

## 2010-07-08 NOTE — Letter (Signed)
Summary: [facility]: Immunoassay Fecal Occult Blood (iFOB) Order Form  [facility] at [facility]  [address] [place], [place] [address]   Phone: [phone]  Fax: [phone]      [facility]: Immunoassay Fecal Occult Blood (iFOB) Order Form   July 04, 2010 MRN: [id number]   Juan Thompson [birth year]   Physicican Name:______duncan___________________  Diagnosis Code:_______v76.49___________________      Crawford Givens MD

## 2010-07-09 ENCOUNTER — Telehealth: Payer: Self-pay | Admitting: Family Medicine

## 2010-07-11 ENCOUNTER — Encounter (INDEPENDENT_AMBULATORY_CARE_PROVIDER_SITE_OTHER): Payer: Self-pay | Admitting: *Deleted

## 2010-07-11 ENCOUNTER — Other Ambulatory Visit: Payer: Self-pay | Admitting: Family Medicine

## 2010-07-11 ENCOUNTER — Other Ambulatory Visit: Payer: 59

## 2010-07-11 DIAGNOSIS — Z1289 Encounter for screening for malignant neoplasm of other sites: Secondary | ICD-10-CM

## 2010-07-11 LAB — FECAL OCCULT BLOOD, IMMUNOCHEMICAL: Fecal Occult Bld: NEGATIVE

## 2010-07-17 NOTE — Progress Notes (Signed)
Summary: Vicodin  Phone Note Refill Request Message from:  [employer] on July 09, 2010 12:21 PM  Refills Requested: Medication #1:  VICODIN 5-500 MG TABS 1 every 6 hrs as needed pain [facility]   Last Fill Date:  07/04/2010   Pharmacy Phone:  [phone]   Method Requested: Telephone to Pharmacy Initial call taken by: Delilah Shan CMA Duncan Dull),  July 09, 2010 12:21 PM  Follow-up for Phone Call        This was just done 31/2 weeks ago. Does he really need a refill this soon? If pharm says yes, pls call pt and ask the situation...he has been very stable on essentially 40 a month for a long time. Follow-up by: Shaune Leeks MD,  July 09, 2010 1:18 PM  Additional Follow-up for Phone Call Additional follow up Details #1::        Spoke to [place] at the pharmacy and was informed that the patient just got this 07/04/10 and that he has been getting this every 10 days. Sydell Axon LPN  July 09, 2010 3:42 PM  Patient needs to be seen per Dr. Hetty Ely.   Left message at work number and home to have patient call back. Sydell Axon LPN  July 09, 2010 5:05 PM     Additional Follow-up for Phone Call Additional follow up Details #2::    Pt says request for vicodin was called in by mistake by his wife, he does not need a refill at this time.     Lowella Petties CMA, AAMA  July 10, 2010 10:22 AM Thank you. Noted. Follow-up by: Shaune Leeks MD,  July 10, 2010 10:29 AM

## 2010-07-17 NOTE — Letter (Signed)
Summary: Results Follow up Letter  [place] at [facility]  [address] [place], [place] [address]   Phone: [phone]  Fax: [phone]    07/11/2010 MRN: [id number]    ANDY ALLENDE [address] [place], [place]  [address]  [place]    Dear Mr. Hallenbeck,  The following are the results of your recent test(s):  Test         Result    Pap Smear:        Normal _____  Not Normal _____ Comments: ______________________________________________________ Cholesterol: LDL(Bad cholesterol):         Your goal is less than:         HDL (Good cholesterol):       Your goal is more than: Comments:  ______________________________________________________ Mammogram:        Normal _____  Not Normal _____ Comments:  ___________________________________________________________________ Hemoccult:        Normal __X___  Not normal _______ Comments:    _____________________________________________________________________ Other Tests:    We routinely do not discuss normal results over the telephone.  If you desire a copy of the results, or you have any questions about this information we can discuss them at your next office visit.   Sincerely,    Dwana Curd. Para March, M.D.  [facility]

## 2010-07-23 ENCOUNTER — Encounter: Payer: Self-pay | Admitting: Family Medicine

## 2010-07-29 NOTE — Miscellaneous (Signed)
  Clinical Lists Changes  Observations: Added new observation of SIGMOID: no neoplasm (09/06/1998 16:59)        Sigmoidoscopy  Procedure date:  09/06/1998  Findings:      no neoplasm

## 2010-07-31 ENCOUNTER — Other Ambulatory Visit: Payer: Self-pay | Admitting: *Deleted

## 2010-07-31 NOTE — Telephone Encounter (Signed)
Is this due?  Please check the last note in centricity [id number]:XWRU med.  Thanks.

## 2010-08-01 ENCOUNTER — Other Ambulatory Visit: Payer: Self-pay | Admitting: Family Medicine

## 2010-08-01 MED ORDER — HYDROCODONE-ACETAMINOPHEN 5-500 MG PO CAPS: 1.0000 | ORAL_CAPSULE | Freq: Four times a day (QID) | ORAL | Status: AC | PRN

## 2010-08-01 NOTE — Telephone Encounter (Signed)
Medication was last filled on 07/08/2010.  Copy in your in box.

## 2010-08-01 NOTE — Telephone Encounter (Signed)
Rx called to pharmacy as instructed. 

## 2010-08-01 NOTE — Telephone Encounter (Signed)
Pt has been using slightly more recently due to being more active caring for his horse that has been sick by having to exercise her regularly and has bothered his back more. She is now better and his need for pain relief should get back to baseline. Will refill today with no refills and watch use to get back to roughly  total of forty in a month. Will start tracking again from today.

## 2010-08-07 NOTE — Letter (Signed)
Summary: Sigmoidoscopy  Sigmoidoscopy   Imported By: Kassie Mends 07/28/2010 10:24:40  _____________________________________________________________________  External Attachment:    Type:   Image     Comment:   External Document

## 2010-09-03 ENCOUNTER — Other Ambulatory Visit: Payer: Self-pay | Admitting: Family Medicine

## 2010-09-03 NOTE — Telephone Encounter (Signed)
Is this okay to refill? 

## 2010-09-08 ENCOUNTER — Other Ambulatory Visit: Payer: Self-pay | Admitting: Family Medicine

## 2010-09-08 NOTE — Telephone Encounter (Signed)
Medication phoned to pharmacy.  

## 2010-09-08 NOTE — Telephone Encounter (Signed)
Please call in.  Thanks.   

## 2010-12-01 ENCOUNTER — Other Ambulatory Visit: Payer: Self-pay | Admitting: Family Medicine

## 2010-12-01 NOTE — Telephone Encounter (Signed)
Received refill request from pharmacy electronically. Is it okay to refill this? Please verify dispense amount and number of refills.

## 2010-12-01 NOTE — Telephone Encounter (Signed)
Will probably become Dr Lianne Bushy pt in Dec or Jan. Will refill meds as previously until then.

## 2010-12-02 NOTE — Telephone Encounter (Signed)
Rx called to pharmacy as instructed. 

## 2011-02-11 ENCOUNTER — Encounter: Payer: Self-pay | Admitting: Family Medicine

## 2011-02-12 ENCOUNTER — Ambulatory Visit (INDEPENDENT_AMBULATORY_CARE_PROVIDER_SITE_OTHER): Payer: 59 | Admitting: Family Medicine

## 2011-02-12 ENCOUNTER — Encounter: Payer: Self-pay | Admitting: Family Medicine

## 2011-02-12 DIAGNOSIS — J069 Acute upper respiratory infection, unspecified: Secondary | ICD-10-CM

## 2011-02-12 MED ORDER — BENZONATATE 100 MG PO CAPS: 100.0000 mg | ORAL_CAPSULE | Freq: Four times a day (QID) | ORAL | Status: DC | PRN

## 2011-02-12 NOTE — Patient Instructions (Signed)
Use Tussionex at night. Use Tessalon during day. Keep lozenge in mouth all day long.  Drink lots of fluids.

## 2011-02-12 NOTE — Assessment & Plan Note (Signed)
See instructions

## 2011-02-12 NOTE — Progress Notes (Signed)
  Subjective:    Patient ID: Juan Thompson, male    DOB: Sep 24, 1955, 55 y.o.   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 He has had sxs since Mon. He had fever in the past 99+ with chills, no headache, ear pain, some rhinitis that is clear, mild ST that is now gone but cough that kept him awake all night. He is productive of gray sputum, improved from when he smoked. No N/V unless coughing hard. No diarrhea. He has had multiple cough medicines. None has worked.    Review of SystemsNoncontributory except as above.       Objective:   Physical Exam  Constitutional: He appears well-developed and well-nourished. No distress.       Mildly hoarse and congested.  HENT:  Head: Normocephalic and atraumatic.  Right Ear: External ear normal.  Left Ear: External ear normal.  Nose: Nose normal.  Mouth/Throat: Oropharynx is clear and moist.  Eyes: Conjunctivae and EOM are normal. Pupils are equal, round, and reactive to light. Right eye exhibits no discharge. Left eye exhibits no discharge.  Neck: Normal range of motion. Neck supple.  Cardiovascular: Normal rate and regular rhythm.   Pulmonary/Chest: Effort normal and breath sounds normal. He has no wheezes.  Lymphadenopathy:    He has no cervical adenopathy.  Skin: He is not diaphoretic.          Assessment & Plan:

## 2011-03-03 ENCOUNTER — Other Ambulatory Visit: Payer: Self-pay | Admitting: Family Medicine

## 2011-03-03 NOTE — Telephone Encounter (Signed)
Rx called to pharmacy as instructed. 

## 2011-03-03 NOTE — Telephone Encounter (Signed)
Received refill electronically from pharmacy. Is it okay to refill medication? 

## 2011-04-06 ENCOUNTER — Ambulatory Visit (INDEPENDENT_AMBULATORY_CARE_PROVIDER_SITE_OTHER): Payer: BC Managed Care – PPO | Admitting: Family Medicine

## 2011-04-06 ENCOUNTER — Encounter: Payer: Self-pay | Admitting: Family Medicine

## 2011-04-06 VITALS — BP 136/102 | HR 91 | Temp 98.3°F | Wt 289.0 lb

## 2011-04-06 DIAGNOSIS — Z23 Encounter for immunization: Secondary | ICD-10-CM

## 2011-04-06 DIAGNOSIS — S0500XA Injury of conjunctiva and corneal abrasion without foreign body, unspecified eye, initial encounter: Secondary | ICD-10-CM | POA: Insufficient documentation

## 2011-04-06 DIAGNOSIS — S058X9A Other injuries of unspecified eye and orbit, initial encounter: Secondary | ICD-10-CM

## 2011-04-06 MED ORDER — ERYTHROMYCIN 5 MG/GM OP OINT: TOPICAL_OINTMENT | Freq: Four times a day (QID) | OPHTHALMIC | Status: AC

## 2011-04-06 NOTE — Assessment & Plan Note (Signed)
Improved today, very small, should not impact vision.  Use topical abx and f/u prn.  He agrees.  D/w pt about eye protection.

## 2011-04-06 NOTE — Progress Notes (Signed)
L eye scratched.  Wears glasses. Was tilling in the garden and got hit by a branch.  It went under the glasses.  This was on Saturday.  It didn't bother him much then but it was irritated yesterday.  No vision changes.  He's been using lubricant drops with some relief.  Today is some better than yesterday.    Meds, vitals, and allergies reviewed.   ROS: See HPI.  Otherwise, noncontributory.  nad ncat Lids wnl Perrl, eomi x2 L fundus wnl B cornea exam wnl on initial inspection and no FB seen.  Small corneal abrasion at 4 o'clock on L eye with staining undermagnification

## 2011-04-06 NOTE — Patient Instructions (Signed)
Use 1cm of the ointment every 6 hours. This should gradually improve.  Take care.  Let us know if you have concerns.

## 2011-05-27 ENCOUNTER — Other Ambulatory Visit: Payer: Self-pay | Admitting: Family Medicine

## 2011-05-28 NOTE — Telephone Encounter (Signed)
Please call in and schedule for CPE this spring.

## 2011-05-28 NOTE — Telephone Encounter (Signed)
Medication phoned to pharmacy.  

## 2011-07-02 ENCOUNTER — Other Ambulatory Visit: Payer: Self-pay

## 2011-07-02 NOTE — Telephone Encounter (Signed)
Received fax refill request from patient's pharmacy.  Okay to refill medication?

## 2011-07-03 MED ORDER — HYDROCODONE-ACETAMINOPHEN 5-500 MG PO TABS: 1.0000 | ORAL_TABLET | Freq: Four times a day (QID) | ORAL | Status: DC | PRN

## 2011-07-03 MED ORDER — CARISOPRODOL 350 MG PO TABS: 350.0000 mg | ORAL_TABLET | Freq: Four times a day (QID) | ORAL | Status: DC | PRN

## 2011-07-03 NOTE — Telephone Encounter (Signed)
Please call in vicodin and soma.

## 2011-07-03 NOTE — Telephone Encounter (Signed)
Prescriptions called into pharmacy and spoke with Neysa Bonito, Pharmacist.

## 2011-07-17 ENCOUNTER — Other Ambulatory Visit: Payer: Self-pay | Admitting: Family Medicine

## 2011-07-29 ENCOUNTER — Other Ambulatory Visit: Payer: Self-pay | Admitting: *Deleted

## 2011-07-29 MED ORDER — HYDROCODONE-ACETAMINOPHEN 5-500 MG PO TABS: 1.0000 | ORAL_TABLET | Freq: Four times a day (QID) | ORAL | Status: DC | PRN

## 2011-07-29 NOTE — Telephone Encounter (Signed)
Medication phoned to pharmacy.  

## 2011-07-29 NOTE — Telephone Encounter (Signed)
Please call in

## 2011-08-19 ENCOUNTER — Other Ambulatory Visit (INDEPENDENT_AMBULATORY_CARE_PROVIDER_SITE_OTHER): Payer: BC Managed Care – PPO

## 2011-08-19 ENCOUNTER — Other Ambulatory Visit: Payer: Self-pay | Admitting: Family Medicine

## 2011-08-19 DIAGNOSIS — Z125 Encounter for screening for malignant neoplasm of prostate: Secondary | ICD-10-CM

## 2011-08-19 DIAGNOSIS — E781 Pure hyperglyceridemia: Secondary | ICD-10-CM

## 2011-08-20 LAB — COMPREHENSIVE METABOLIC PANEL
ALT: 30 IU/L (ref 0–44)
AST: 21 IU/L (ref 0–40)
Albumin/Globulin Ratio: 1.8 (ref 1.1–2.5)
Albumin: 4.4 g/dL (ref 3.5–5.5)
Alkaline Phosphatase: 76 IU/L (ref 25–150)
BUN/Creatinine Ratio: 20 (ref 9–20)
BUN: 19 mg/dL (ref 6–24)
CO2: 22 mmol/L (ref 20–32)
Calcium: 9 mg/dL (ref 8.7–10.2)
Chloride: 102 mmol/L (ref 97–108)
Creatinine, Ser: 0.97 mg/dL (ref 0.76–1.27)
GFR calc Af Amer: 102 mL/min/{1.73_m2} (ref >59)
GFR calc non Af Amer: 88 mL/min/{1.73_m2} (ref >59)
Globulin, Total: 2.4 g/dL (ref 1.5–4.5)
Glucose: 94 mg/dL (ref 65–99)
Potassium: 4.2 mmol/L (ref 3.5–5.2)
Sodium: 140 mmol/L (ref 134–144)
Total Bilirubin: 0.5 mg/dL (ref 0.0–1.2)
Total Protein: 6.8 g/dL (ref 6.0–8.5)

## 2011-08-20 LAB — LIPID PANEL
Chol/HDL Ratio: 5.4 ratio units — ABNORMAL HIGH (ref 0.0–5.0)
Cholesterol, Total: 200 mg/dL — ABNORMAL HIGH (ref 100–199)
HDL: 37 mg/dL — ABNORMAL LOW (ref >39)
LDL Calculated: 122 mg/dL — ABNORMAL HIGH (ref 0–99)
Triglycerides: 203 mg/dL — ABNORMAL HIGH (ref 0–149)
VLDL Cholesterol Cal: 41 mg/dL — ABNORMAL HIGH (ref 5–40)

## 2011-08-20 LAB — PSA: PSA: 0.5 ng/mL (ref 0.0–4.0)

## 2011-08-25 ENCOUNTER — Encounter: Payer: Self-pay | Admitting: Family Medicine

## 2011-08-25 ENCOUNTER — Ambulatory Visit (INDEPENDENT_AMBULATORY_CARE_PROVIDER_SITE_OTHER): Payer: BC Managed Care – PPO | Admitting: Family Medicine

## 2011-08-25 VITALS — BP 142/84 | HR 81 | Temp 98.3°F | Wt 286.0 lb

## 2011-08-25 DIAGNOSIS — Z0000 Encounter for general adult medical examination without abnormal findings: Secondary | ICD-10-CM

## 2011-08-25 DIAGNOSIS — I10 Essential (primary) hypertension: Secondary | ICD-10-CM

## 2011-08-25 DIAGNOSIS — B079 Viral wart, unspecified: Secondary | ICD-10-CM

## 2011-08-25 DIAGNOSIS — S139XXA Sprain of joints and ligaments of unspecified parts of neck, initial encounter: Secondary | ICD-10-CM

## 2011-08-25 DIAGNOSIS — M25569 Pain in unspecified knee: Secondary | ICD-10-CM

## 2011-08-25 DIAGNOSIS — Z1211 Encounter for screening for malignant neoplasm of colon: Secondary | ICD-10-CM

## 2011-08-25 MED ORDER — HYDROCODONE-ACETAMINOPHEN 5-500 MG PO TABS: 1.0000 | ORAL_TABLET | Freq: Four times a day (QID) | ORAL | Status: DC | PRN

## 2011-08-25 MED ORDER — AMLODIPINE BESYLATE 5 MG PO TABS: 5.0000 mg | ORAL_TABLET | Freq: Every day | ORAL | Status: DC

## 2011-08-25 MED ORDER — SILDENAFIL CITRATE 100 MG PO TABS: 50.0000 mg | ORAL_TABLET | Freq: Every day | ORAL | Status: AC | PRN

## 2011-08-25 NOTE — Patient Instructions (Addendum)
I would get a flu shot each fall.   Go to the lab on the way out.   Try to walk more.  Take care.

## 2011-08-25 NOTE — Progress Notes (Signed)
CPE- See plan.  Routine anticipatory guidance given to patient.  See health maintenance. PSA wnl.  PSA options were discussed along with recent recs.  No indication for future psa, since patient is low risk and there is no FH of prosate CA.  He declined testing of PSA after this year.   Colon cancer screening. D/w patient Juan Thompson for colon cancer screening, including IFOB vs. colonoscopy.  Risks and benefits of both were discussed and patient voiced understanding.  Pt elects for: IFOB.   D/w pt about exercise, walking.  Labs d/w pt.  Cholesterol up some and he needs to work on diet/weight.  Flu 2012 Tetanus 2006  Itchy spot on back. Itchy for for a few years, now with a lesion in the area.  Knee OA.  In a brace today.  Taking vicodin and ibuprofen without ADE.  His functional status is good and the pain is episodic.    Neck spasm.  Taking prn soma with some relief.  No ADE.  Not sedated.    Hypertension:    Using medication without problems or lightheadedness: yes Chest pain with exertion:no Edema:no Short of breath:no  PMH and SH reviewed  Meds, vitals, and allergies reviewed.   ROS: See HPI.  Otherwise negative.    GEN: nad, alert and oriented HEENT: mucous membranes moist NECK: supple w/o LA CV: rrr. PULM: ctab, no inc wob ABD: soft, +bs EXT: no edema SKIN: no acute rash but warty lesion, <1cm on back noted.  Prostate gland firm and smooth, no enlargement, nodularity, tenderness, mass, asymmetry or induration. L knee in brace, walking with mild limp

## 2011-08-27 ENCOUNTER — Encounter: Payer: Self-pay | Admitting: Family Medicine

## 2011-08-27 DIAGNOSIS — M25569 Pain in unspecified knee: Secondary | ICD-10-CM | POA: Insufficient documentation

## 2011-08-27 DIAGNOSIS — B079 Viral wart, unspecified: Secondary | ICD-10-CM | POA: Insufficient documentation

## 2011-08-27 DIAGNOSIS — Z0000 Encounter for general adult medical examination without abnormal findings: Secondary | ICD-10-CM | POA: Insufficient documentation

## 2011-08-27 NOTE — Assessment & Plan Note (Signed)
Continue current meds, continue to work on diet and weight.  

## 2011-08-27 NOTE — Assessment & Plan Note (Signed)
Continue prn vicodin, needs to work on weight.  He understands.  No ADE from meds, no misuse.  Uses, about 40 vicodin in a month.

## 2011-08-27 NOTE — Assessment & Plan Note (Signed)
Continue prn soma, tolerated well.

## 2011-08-27 NOTE — Assessment & Plan Note (Signed)
Verbal consent, treated with 3 freeze thaw cycles and tolerated well. F/u prn.  Routine instructions given.

## 2011-09-15 LAB — FECAL OCCULT BLOOD, IMMUNOCHEMICAL: Fecal Occult Bld: NEGATIVE

## 2011-09-28 ENCOUNTER — Other Ambulatory Visit: Payer: Self-pay | Admitting: *Deleted

## 2011-09-28 NOTE — Telephone Encounter (Signed)
vicodin refill

## 2011-09-29 ENCOUNTER — Other Ambulatory Visit: Payer: Self-pay

## 2011-09-29 MED ORDER — HYDROCODONE-ACETAMINOPHEN 5-500 MG PO TABS: 1.0000 | ORAL_TABLET | Freq: Four times a day (QID) | ORAL | Status: DC | PRN

## 2011-09-29 NOTE — Telephone Encounter (Signed)
pt wanted verification hydrocodone apap had been called in to [facility]. Med was called to [facility] [address] this morning at 10:30am. Pt said he would pick up rx.

## 2011-09-29 NOTE — Telephone Encounter (Signed)
Medication phoned to pharmacy.  

## 2011-09-29 NOTE — Telephone Encounter (Signed)
Please call in

## 2011-10-19 ENCOUNTER — Other Ambulatory Visit: Payer: Self-pay | Admitting: *Deleted

## 2011-10-19 MED ORDER — IBUPROFEN 800 MG PO TABS: 800.0000 mg | ORAL_TABLET | Freq: Three times a day (TID) | ORAL | Status: DC | PRN

## 2011-10-19 MED ORDER — CARISOPRODOL 350 MG PO TABS: 350.0000 mg | ORAL_TABLET | Freq: Four times a day (QID) | ORAL | Status: DC | PRN

## 2011-10-19 NOTE — Telephone Encounter (Signed)
Ibuprofen sent, please call in the soma.

## 2011-10-19 NOTE — Telephone Encounter (Signed)
Received faxed refill request from pharmacy. Last office visit 08/25/11. Is it okay to refill medication? 

## 2011-10-20 NOTE — Telephone Encounter (Signed)
Medication phoned to pharmacy.  

## 2011-12-10 ENCOUNTER — Other Ambulatory Visit: Payer: Self-pay | Admitting: *Deleted

## 2011-12-10 MED ORDER — CARISOPRODOL 350 MG PO TABS: 350.0000 mg | ORAL_TABLET | Freq: Four times a day (QID) | ORAL | Status: DC | PRN

## 2011-12-10 NOTE — Telephone Encounter (Signed)
Medicine called to pharmacy. 

## 2011-12-10 NOTE — Telephone Encounter (Signed)
Faxed refill request from [facility] [address], last filled 11/22/11.

## 2011-12-10 NOTE — Telephone Encounter (Signed)
Please call in.  Thanks.   

## 2011-12-22 ENCOUNTER — Other Ambulatory Visit: Payer: Self-pay | Admitting: *Deleted

## 2011-12-22 MED ORDER — HYDROCODONE-ACETAMINOPHEN 5-500 MG PO TABS: 1.0000 | ORAL_TABLET | Freq: Four times a day (QID) | ORAL | Status: DC | PRN

## 2011-12-22 NOTE — Telephone Encounter (Signed)
Please call in.  Thanks.   

## 2011-12-22 NOTE — Telephone Encounter (Signed)
Received faxed refill request from pharmacy. Last office visit 08/25/11. Is it okay to refill medication?

## 2011-12-22 NOTE — Telephone Encounter (Signed)
Medication phoned to pharmacy.  

## 2012-02-17 ENCOUNTER — Other Ambulatory Visit: Payer: Self-pay | Admitting: *Deleted

## 2012-02-17 MED ORDER — HYDROCODONE-ACETAMINOPHEN 5-500 MG PO TABS: 1.0000 | ORAL_TABLET | Freq: Four times a day (QID) | ORAL | Status: DC | PRN

## 2012-02-17 NOTE — Telephone Encounter (Signed)
Rx called to pharmacy as instructed. 

## 2012-02-17 NOTE — Telephone Encounter (Signed)
Please call in.  Thanks.   

## 2012-03-16 ENCOUNTER — Encounter: Payer: Self-pay | Admitting: Family Medicine

## 2012-03-16 ENCOUNTER — Ambulatory Visit (INDEPENDENT_AMBULATORY_CARE_PROVIDER_SITE_OTHER): Payer: BC Managed Care – PPO | Admitting: Family Medicine

## 2012-03-16 VITALS — BP 144/88 | HR 84 | Temp 98.8°F | Wt 292.5 lb

## 2012-03-16 DIAGNOSIS — R05 Cough: Secondary | ICD-10-CM | POA: Insufficient documentation

## 2012-03-16 DIAGNOSIS — R059 Cough, unspecified: Secondary | ICD-10-CM

## 2012-03-16 MED ORDER — HYDROCOD POLST-CHLORPHEN POLST 10-8 MG/5ML PO LQCR: 5.0000 mL | Freq: Two times a day (BID) | ORAL | Status: DC | PRN

## 2012-03-16 NOTE — Patient Instructions (Addendum)
Drink plenty of fluids, take tussionex as needed (sedation caution), and gargle with warm salt water for your throat.  This should gradually improve.  Take care.  Let us know if you have other concerns.   Out of work in meantime.  I would get a flu shot when you are feeling better.

## 2012-03-16 NOTE — Progress Notes (Signed)
duration of symptoms: started with scratchy throat yesterday. Worse today Rhinorrhea: no Congestion: chest congestion ear pain: no sore throat: yes Cough: yes, some sputum Myalgias: some other concerns: voice change noted.  No fevers yet Fatigued.  Chest sore with cough.   Some sick contacts at home. Has used tussionex prev with some relief.    ROS: See HPI.  Otherwise negative.    Meds, vitals, and allergies reviewed.   GEN: nad, alert and oriented HEENT: mucous membranes moist, TM w/o erythema, nasal epithelium injected, OP with cobblestoning NECK: supple w/o LA CV: rrr. PULM: ctab, no inc wob, no focal dec in BS ABD: soft, +bs EXT: no edema

## 2012-03-16 NOTE — Assessment & Plan Note (Signed)
Nontoxic, likely viral. Prevalent in community.  Would use cough suppressant, sedation caution.  F/u prn.  supportive tx o/w. He agrees.

## 2012-03-31 ENCOUNTER — Telehealth: Payer: Self-pay | Admitting: *Deleted

## 2012-03-31 NOTE — Telephone Encounter (Signed)
Beginning May 11, 2012, the manufacturers will no longer be making Hydrocodone 5/500 and they are not able to get it now.  Is it okay to replace it with Hydrocodone 5/325 or do you wish to switch to another medication?

## 2012-03-31 NOTE — Telephone Encounter (Signed)
Change the rx to 5/325.  Same sig and number o/w.  Thanks.

## 2012-04-01 NOTE — Telephone Encounter (Signed)
Pharmacy advised.  Meds list changed.

## 2012-04-19 ENCOUNTER — Encounter: Payer: Self-pay | Admitting: Family Medicine

## 2012-04-19 ENCOUNTER — Ambulatory Visit (INDEPENDENT_AMBULATORY_CARE_PROVIDER_SITE_OTHER): Payer: BC Managed Care – PPO | Admitting: Family Medicine

## 2012-04-19 VITALS — BP 158/96 | HR 83 | Temp 98.3°F | Wt 299.0 lb

## 2012-04-19 DIAGNOSIS — L609 Nail disorder, unspecified: Secondary | ICD-10-CM

## 2012-04-19 DIAGNOSIS — Z23 Encounter for immunization: Secondary | ICD-10-CM

## 2012-04-19 NOTE — Patient Instructions (Addendum)
Use the nail file as needed and let the nails grow out.  When your trim them, trim straight across and don't cut them short.

## 2012-04-20 DIAGNOSIS — L609 Nail disorder, unspecified: Secondary | ICD-10-CM | POA: Insufficient documentation

## 2012-04-20 NOTE — Assessment & Plan Note (Signed)
Likely combination of chronic trauma and possible onychomycosis.  Treatment not needed for possible fungal infection.  Okay to file down and then trim when long enough, trim straight across to prevent ingrown nail.  He understood..  Reassured.  F/u prn.  Flu shot today.

## 2012-04-20 NOTE — Progress Notes (Signed)
L 2nd toe thickened and he wanted this checked.  Had bumped the toe prev.  Question of ingrown nail.  Had to file it down due to vertical height of nail, in addition to trimming.   Meds, vitals, and allergies reviewed.   ROS: See HPI.  Otherwise, noncontributory.  nad L foot with normal inspection except for thickened L 2nd nail.  Not ingrown.  No erythema.

## 2012-06-28 ENCOUNTER — Other Ambulatory Visit: Payer: Self-pay | Admitting: *Deleted

## 2012-06-28 NOTE — Telephone Encounter (Signed)
Faxed refill request.  Last filled:  05/18/2012  Please advise.

## 2012-06-29 MED ORDER — HYDROCODONE-ACETAMINOPHEN 5-325 MG PO TABS: 1.0000 | ORAL_TABLET | Freq: Four times a day (QID) | ORAL | Status: DC | PRN

## 2012-06-29 NOTE — Telephone Encounter (Signed)
Please call in.  Thanks.   

## 2012-06-30 NOTE — Telephone Encounter (Signed)
Rx phoned to pharmacy.  

## 2012-07-01 ENCOUNTER — Other Ambulatory Visit: Payer: Self-pay | Admitting: *Deleted

## 2012-07-07 ENCOUNTER — Other Ambulatory Visit: Payer: Self-pay | Admitting: *Deleted

## 2012-07-07 MED ORDER — CARISOPRODOL 350 MG PO TABS: 350.0000 mg | ORAL_TABLET | Freq: Four times a day (QID) | ORAL | Status: DC | PRN

## 2012-07-07 NOTE — Telephone Encounter (Signed)
Please call in

## 2012-07-07 NOTE — Telephone Encounter (Signed)
Faxed refill request.  Last filled 04/23/12  Please advise.

## 2012-07-08 NOTE — Telephone Encounter (Signed)
Rx phoned to pharmacy.  

## 2012-07-14 ENCOUNTER — Other Ambulatory Visit: Payer: Self-pay | Admitting: *Deleted

## 2012-07-14 ENCOUNTER — Encounter: Payer: Self-pay | Admitting: Family Medicine

## 2012-07-14 ENCOUNTER — Ambulatory Visit (INDEPENDENT_AMBULATORY_CARE_PROVIDER_SITE_OTHER): Payer: BC Managed Care – PPO | Admitting: Family Medicine

## 2012-07-14 VITALS — BP 142/88 | HR 91 | Temp 98.3°F | Wt 284.0 lb

## 2012-07-14 DIAGNOSIS — J069 Acute upper respiratory infection, unspecified: Secondary | ICD-10-CM

## 2012-07-14 MED ORDER — HYDROCOD POLST-CHLORPHEN POLST 10-8 MG/5ML PO LQCR: 5.0000 mL | Freq: Two times a day (BID) | ORAL | Status: DC | PRN

## 2012-07-14 MED ORDER — HYDROCODONE-HOMATROPINE 5-1.5 MG/5ML PO SYRP: 5.0000 mL | ORAL_SOLUTION | Freq: Three times a day (TID) | ORAL | Status: DC | PRN

## 2012-07-14 NOTE — Progress Notes (Signed)
2 days of sx.  Started with ST.  Then aches, cough, fever.  Chest is sore from cough.  No ear or facial pain.  No rash.  Known sick contacts.  No diarrhea but some loose stools, no abd pain.  No vomiting.    Meds, vitals, and allergies reviewed.   ROS: See HPI.  Otherwise, noncontributory.  GEN: nad, alert and oriented HEENT: mucous membranes moist, tm w/o erythema, nasal exam w/o erythema, clear discharge noted,  OP with cobblestoning, sinuses not ttp x4 NECK: supple w/o LA CV: rrr.   PULM: ctab, no inc wob EXT: no edema SKIN: no acute rash

## 2012-07-14 NOTE — Addendum Note (Signed)
Addended by: Joaquim Nam on: 07/14/2012 03:00 PM   Modules accepted: Orders, Medications

## 2012-07-14 NOTE — Patient Instructions (Signed)
Take the cough medicine as needed (sedation caution).  Drink plenty of fluids, take ibuprofen as needed with food, and gargle with warm salt water for your throat.  This should gradually improve.  Take care.  Let us know if you have other concerns.    

## 2012-07-14 NOTE — Assessment & Plan Note (Addendum)
Likely viral, nontoxic.  Sick exposure known.  Would use hycodan prn for cough, sedation caution.  Rest and fluids o/w.  F/u prn.  Pt agrees.  Addendum, he asked to change to tussionex.  Hycodan rx shredded, witnessed by L [place]qu[place].

## 2012-08-24 ENCOUNTER — Encounter: Payer: Self-pay | Admitting: Family Medicine

## 2012-08-24 ENCOUNTER — Ambulatory Visit (INDEPENDENT_AMBULATORY_CARE_PROVIDER_SITE_OTHER): Payer: BC Managed Care – PPO | Admitting: Family Medicine

## 2012-08-24 VITALS — BP 130/80 | HR 79 | Temp 98.3°F | Wt 278.0 lb

## 2012-08-24 DIAGNOSIS — J069 Acute upper respiratory infection, unspecified: Secondary | ICD-10-CM

## 2012-08-24 MED ORDER — DOXYCYCLINE HYCLATE 100 MG PO TABS: 100.0000 mg | ORAL_TABLET | Freq: Two times a day (BID) | ORAL | Status: DC

## 2012-08-24 MED ORDER — HYDROCOD POLST-CHLORPHEN POLST 10-8 MG/5ML PO LQCR: 5.0000 mL | Freq: Two times a day (BID) | ORAL | Status: DC | PRN

## 2012-08-24 NOTE — Assessment & Plan Note (Signed)
Restart tussionex, hold abx for now and start if sx continue.  Nontoxic.  Okay for outpatient f/u.  He agrees.

## 2012-08-24 NOTE — Progress Notes (Signed)
He got better from the illness in March. Wife was sick again and then he got sick again.  He started to have these symptoms last week.  He was trying to make it through work, but had to stay out yesterday. Rested yesterday.  Fatigued.  No fevers known but he is soaking his clothes at night with sweats. Not very stuffy, not more than normal.  He has more chest symptoms, some clear sputum noted.  Occ wheeze noted.  Cough is annoying to patient.  Some diarrhea, occ.  Dec in appetite. He isn't improving, even with rest.  Tussionex helped with cough prev.  Some HA- occipital, but this isn't unusual.    Meds, vitals, and allergies reviewed.   ROS: See HPI.  Otherwise, noncontributory.  GEN: nad, alert and oriented HEENT: mucous membranes moist, tm w/o erythema, nasal exam w/o erythema, clear discharge noted,  OP with cobblestoning NECK: supple w/o LA CV: rrr.   PULM: ctab, no inc wob EXT: no edema SKIN: no acute rash

## 2012-08-24 NOTE — Patient Instructions (Addendum)
Use the cough medicine as needed, sedation caution.  Start the antibiotics in a few days if you don't improve.   Take care. Get some rest.

## 2012-09-27 ENCOUNTER — Other Ambulatory Visit: Payer: Self-pay

## 2012-09-27 MED ORDER — HYDROCODONE-ACETAMINOPHEN 5-325 MG PO TABS: 1.0000 | ORAL_TABLET | Freq: Four times a day (QID) | ORAL | Status: DC | PRN

## 2012-09-27 NOTE — Telephone Encounter (Signed)
Please call in.  Thanks.   

## 2012-09-27 NOTE — Telephone Encounter (Signed)
Pt left v/m requesting refill hydrocodone apap to [facility] [address]. Pt request call back.

## 2012-09-28 NOTE — Telephone Encounter (Signed)
Patient notified that script has been called to the pharmacy. 

## 2012-09-28 NOTE — Telephone Encounter (Signed)
Prescription called to pharmacy as instructed. Unable to get patient by phone, will try again later.

## 2012-10-07 ENCOUNTER — Other Ambulatory Visit: Payer: Self-pay

## 2012-10-07 MED ORDER — AMLODIPINE BESYLATE 5 MG PO TABS: 5.0000 mg | ORAL_TABLET | Freq: Every day | ORAL | Status: DC

## 2012-10-07 NOTE — Telephone Encounter (Signed)
Pt left v/m requesting refill amlodipine to [facility] [address]. Unable to reach pt by phone.

## 2012-11-19 ENCOUNTER — Other Ambulatory Visit: Payer: Self-pay | Admitting: Family Medicine

## 2012-11-21 NOTE — Telephone Encounter (Signed)
Sent!

## 2012-11-21 NOTE — Telephone Encounter (Signed)
Electronic refill request.  Please advise. 

## 2012-11-27 ENCOUNTER — Other Ambulatory Visit: Payer: Self-pay | Admitting: Family Medicine

## 2012-11-27 DIAGNOSIS — I10 Essential (primary) hypertension: Secondary | ICD-10-CM

## 2012-12-01 ENCOUNTER — Encounter: Payer: Self-pay | Admitting: *Deleted

## 2012-12-02 ENCOUNTER — Other Ambulatory Visit (INDEPENDENT_AMBULATORY_CARE_PROVIDER_SITE_OTHER): Payer: BC Managed Care – PPO

## 2012-12-02 DIAGNOSIS — I10 Essential (primary) hypertension: Secondary | ICD-10-CM

## 2012-12-03 LAB — COMPREHENSIVE METABOLIC PANEL
ALT: 31 IU/L (ref 0–44)
AST: 24 IU/L (ref 0–40)
Albumin/Globulin Ratio: 1.7 (ref 1.1–2.5)
Albumin: 4.4 g/dL (ref 3.5–5.5)
Alkaline Phosphatase: 87 IU/L (ref 39–117)
BUN/Creatinine Ratio: 17 (ref 9–20)
BUN: 16 mg/dL (ref 6–24)
CO2: 26 mmol/L (ref 18–29)
Calcium: 9.2 mg/dL (ref 8.7–10.2)
Chloride: 102 mmol/L (ref 97–108)
Creatinine, Ser: 0.94 mg/dL (ref 0.76–1.27)
GFR calc Af Amer: 105 mL/min/{1.73_m2} (ref >59)
GFR calc non Af Amer: 91 mL/min/{1.73_m2} (ref >59)
Globulin, Total: 2.6 g/dL (ref 1.5–4.5)
Glucose: 91 mg/dL (ref 65–99)
Potassium: 4.5 mmol/L (ref 3.5–5.2)
Sodium: 140 mmol/L (ref 134–144)
Total Bilirubin: 0.4 mg/dL (ref 0.0–1.2)
Total Protein: 7 g/dL (ref 6.0–8.5)

## 2012-12-03 LAB — LIPID PANEL
Chol/HDL Ratio: 5.9 ratio units — ABNORMAL HIGH (ref 0.0–5.0)
Cholesterol, Total: 199 mg/dL (ref 100–199)
HDL: 34 mg/dL — ABNORMAL LOW (ref >39)
LDL Calculated: 132 mg/dL — ABNORMAL HIGH (ref 0–99)
Triglycerides: 164 mg/dL — ABNORMAL HIGH (ref 0–149)
VLDL Cholesterol Cal: 33 mg/dL (ref 5–40)

## 2012-12-09 ENCOUNTER — Ambulatory Visit (INDEPENDENT_AMBULATORY_CARE_PROVIDER_SITE_OTHER): Payer: BC Managed Care – PPO | Admitting: Family Medicine

## 2012-12-09 ENCOUNTER — Encounter: Payer: Self-pay | Admitting: Family Medicine

## 2012-12-09 VITALS — BP 144/84 | HR 88 | Temp 98.3°F | Ht 68.75 in | Wt 283.5 lb

## 2012-12-09 DIAGNOSIS — Z0000 Encounter for general adult medical examination without abnormal findings: Secondary | ICD-10-CM

## 2012-12-09 DIAGNOSIS — M25562 Pain in left knee: Secondary | ICD-10-CM

## 2012-12-09 DIAGNOSIS — M25569 Pain in unspecified knee: Secondary | ICD-10-CM

## 2012-12-09 DIAGNOSIS — I10 Essential (primary) hypertension: Secondary | ICD-10-CM

## 2012-12-09 DIAGNOSIS — Z1211 Encounter for screening for malignant neoplasm of colon: Secondary | ICD-10-CM

## 2012-12-09 MED ORDER — HYDROCODONE-ACETAMINOPHEN 5-325 MG PO TABS: 1.0000 | ORAL_TABLET | Freq: Four times a day (QID) | ORAL | Status: DC | PRN

## 2012-12-09 MED ORDER — AMLODIPINE BESYLATE 5 MG PO TABS: 5.0000 mg | ORAL_TABLET | Freq: Every day | ORAL | Status: DC

## 2012-12-09 NOTE — Assessment & Plan Note (Signed)
dw pt about losing weight, diet.  Needs to work on weight.  Continue current meds.  He agrees.  Labs d/w pt, esp lipids and sugar.

## 2012-12-09 NOTE — Assessment & Plan Note (Signed)
Routine anticipatory guidance given to patient.  See health maintenance. Tetanus 2006 PNA 2012 Flu yearly Prostate cancer screening and PSA options (with potential risks and benefits of testing vs not testing) were discussed along with recent recs/guidelines.  He declined testing PSA at this point. D/w patient [id number] for colon cancer screening, including IFOB vs. colonoscopy.  Risks and benefits of both were discussed and patient voiced understanding.  Pt elects YQM:VHQI.  Advance directive d/w pt.  Would have his wife designated if incapacitated.  Encouraged living will.

## 2012-12-09 NOTE — Patient Instructions (Addendum)
Go to the lab on the way out.  We'll contact you with your lab report.  I would get a flu shot each fall.  Cut out the bread and keep working on your weight. Take care.

## 2012-12-09 NOTE — Assessment & Plan Note (Signed)
Using vicodin for his knee pain that started with a prev barn door injury.  He's still trying to put off surgery.  No ADE.  He continues to have medial and anterior knee pain.  Exam unchanged from prev.  Continue as is.  He is using about 40 per month.  He had needed a few more recently after a flare of pain after working in the yard/around the house.  Refill done today.  D/w pt about losing weight.

## 2012-12-09 NOTE — Progress Notes (Signed)
CPE- See plan.  Routine anticipatory guidance given to patient.  See health maintenance. Tetanus 2006 PNA 2012 Flu yearly Prostate cancer screening and PSA options (with potential risks and benefits of testing vs not testing) were discussed along with recent recs/guidelines.  He declined testing PSA at this point. D/w patient YQ:[id number] for colon cancer screening, including IFOB vs. colonoscopy.  Risks and benefits of both were discussed and patient voiced understanding.  Pt elects GEX:BMWU.  Advance directive d/w pt.  Would have his wife designated if incapacitated.  Encouraged living will.    Hypertension:    Using medication without problems or lightheadedness: yes Chest pain with exertion:no Edema:no Short of breath:no  Neck pain- using soma w/o ADE.    Using vicodin for his knee pain that started with a prev barn door injury.  He's still trying to put off surgery.  No ADE.  He continues to have medial and anterior knee pain.   PMH and SH reviewed  Meds, vitals, and allergies reviewed.   ROS: See HPI.  Otherwise negative.    GEN: nad, alert and oriented HEENT: mucous membranes moist NECK: supple w/o LA CV: rrr. PULM: ctab, no inc wob ABD: soft, +bs EXT: no edema SKIN: no acute rash L knee with crepitus noted but ACL MCL LCL intact on testing.  Tender medially and anteriorly w/o locking

## 2012-12-09 NOTE — Assessment & Plan Note (Signed)
Is using soma w/o ADE.  Continue as is.

## 2012-12-19 ENCOUNTER — Other Ambulatory Visit (INDEPENDENT_AMBULATORY_CARE_PROVIDER_SITE_OTHER): Payer: BC Managed Care – PPO

## 2012-12-19 DIAGNOSIS — Z1211 Encounter for screening for malignant neoplasm of colon: Secondary | ICD-10-CM

## 2012-12-19 LAB — FECAL OCCULT BLOOD, IMMUNOCHEMICAL: Fecal Occult Bld: NEGATIVE

## 2013-02-08 ENCOUNTER — Other Ambulatory Visit: Payer: Self-pay | Admitting: Family Medicine

## 2013-02-08 NOTE — Telephone Encounter (Signed)
Please call in

## 2013-02-08 NOTE — Telephone Encounter (Signed)
Ok to refill 

## 2013-02-09 NOTE — Telephone Encounter (Signed)
Medication phoned to pharmacy.  

## 2013-02-21 ENCOUNTER — Ambulatory Visit: Payer: BC Managed Care – PPO

## 2013-02-27 ENCOUNTER — Encounter: Payer: Self-pay | Admitting: Podiatry

## 2013-03-06 ENCOUNTER — Encounter: Payer: Self-pay | Admitting: *Deleted

## 2013-03-07 ENCOUNTER — Encounter: Payer: Self-pay | Admitting: Podiatry

## 2013-03-07 ENCOUNTER — Ambulatory Visit (INDEPENDENT_AMBULATORY_CARE_PROVIDER_SITE_OTHER): Payer: BC Managed Care – PPO | Admitting: Podiatry

## 2013-03-07 ENCOUNTER — Ambulatory Visit (INDEPENDENT_AMBULATORY_CARE_PROVIDER_SITE_OTHER): Payer: BC Managed Care – PPO

## 2013-03-07 VITALS — BP 106/76 | HR 90 | Resp 12 | Ht 69.0 in | Wt 280.0 lb

## 2013-03-07 DIAGNOSIS — Z9889 Other specified postprocedural states: Secondary | ICD-10-CM

## 2013-03-07 DIAGNOSIS — M2021 Hallux rigidus, right foot: Secondary | ICD-10-CM

## 2013-03-07 DIAGNOSIS — M2020 Hallux rigidus, unspecified foot: Secondary | ICD-10-CM

## 2013-03-07 DIAGNOSIS — M722 Plantar fascial fibromatosis: Secondary | ICD-10-CM

## 2013-03-07 NOTE — Progress Notes (Signed)
Subjective:     Patient ID: Juan Thompson, male   DOB: 1956/02/25, 57 y.o.   MRN: [id number]  HPI patient states I am doing well with my right foot with still some swelling and some pain in my arches ifon it a lot   Review of Systems     Objective:   Physical Exam  Nursing note and vitals reviewed. Constitutional: He is oriented to person, place, and time.  Cardiovascular: Intact distal pulses.   Musculoskeletal: Normal range of motion.  Neurological: He is oriented to person, place, and time.   Negative Homans sign and neurological sensation intact   patient's right first MPJ motion is very good with 30 dorsiflexion 20 plantar flexion and no crepitus with mild discomfort in the medial fascial arch noted  Assessment:     Well-healing surgical site right with no restriction of motion noted    Plan:     Advised on continued physical therapy and advised on possibility for orthotics when I review again in 5 weeks

## 2013-03-16 ENCOUNTER — Encounter: Payer: Self-pay | Admitting: Family Medicine

## 2013-03-16 ENCOUNTER — Other Ambulatory Visit: Payer: Self-pay

## 2013-03-16 MED ORDER — HYDROCODONE-ACETAMINOPHEN 5-325 MG PO TABS: 1.0000 | ORAL_TABLET | Freq: Four times a day (QID) | ORAL | Status: DC | PRN

## 2013-03-16 NOTE — Telephone Encounter (Signed)
Pt left v/m requesting rx hydrocodone apap. Call when ready for pick up.  

## 2013-03-16 NOTE — Telephone Encounter (Signed)
Printed.  Thanks.  

## 2013-03-16 NOTE — Telephone Encounter (Signed)
Left voicemail letting pt know Rx ready for pick up 

## 2013-04-11 ENCOUNTER — Encounter: Payer: BC Managed Care – PPO | Admitting: Podiatry

## 2013-04-18 ENCOUNTER — Encounter: Payer: BC Managed Care – PPO | Admitting: Podiatry

## 2013-04-21 ENCOUNTER — Encounter: Payer: Self-pay | Admitting: Family Medicine

## 2013-08-17 ENCOUNTER — Other Ambulatory Visit: Payer: Self-pay | Admitting: Family Medicine

## 2013-08-17 NOTE — Telephone Encounter (Signed)
Please call in.  Thanks.   

## 2013-08-17 NOTE — Telephone Encounter (Signed)
Electronic refill request.  Last filled:  30 tablets with 2RF 02/08/2013.

## 2013-08-18 NOTE — Telephone Encounter (Signed)
Medication phoned to pharmacy.  

## 2013-09-08 ENCOUNTER — Ambulatory Visit (INDEPENDENT_AMBULATORY_CARE_PROVIDER_SITE_OTHER): Payer: BC Managed Care – PPO

## 2013-09-08 ENCOUNTER — Ambulatory Visit (INDEPENDENT_AMBULATORY_CARE_PROVIDER_SITE_OTHER): Payer: BC Managed Care – PPO | Admitting: Podiatry

## 2013-09-08 ENCOUNTER — Encounter: Payer: Self-pay | Admitting: Podiatry

## 2013-09-08 VITALS — Resp 16 | Ht 68.0 in | Wt 270.0 lb

## 2013-09-08 DIAGNOSIS — M79609 Pain in unspecified limb: Secondary | ICD-10-CM

## 2013-09-08 DIAGNOSIS — M779 Enthesopathy, unspecified: Secondary | ICD-10-CM

## 2013-09-08 DIAGNOSIS — M79673 Pain in unspecified foot: Secondary | ICD-10-CM

## 2013-09-08 MED ORDER — TRIAMCINOLONE ACETONIDE 10 MG/ML IJ SUSP
10.0000 mg | Freq: Once | INTRAMUSCULAR | Status: AC
  Administered 2013-09-08: 10 mg

## 2013-09-08 MED ORDER — DICLOFENAC SODIUM 75 MG PO TBEC: 75.0000 mg | DELAYED_RELEASE_TABLET | Freq: Two times a day (BID) | ORAL | Status: DC

## 2013-09-08 NOTE — Progress Notes (Signed)
Subjective:     Patient ID: Juan Thompson, male   DOB: [birth year], 58 y.o.   MRN: [id number]  HPI patient states I'm having a lot of pain in my right forefoot and the toe joint that he worked on is doing very well with no discomfort and good range of motion noted   Review of Systems     Objective:   Physical Exam Neurovascular status intact with incision site right first metatarsal with healed very well with range of motion 30 dorsiflexion and plantarflexion with no crepitus within the joint. Patient is found to have exquisite discomfort second metatarsophalangeal joint with fluid buildup noted    Assessment:     Capsulitis right second MPJ is probably due to better gait processes than prior    Plan:     H&P and x-ray reviewed proximal nerve block administered and I was able to get out a small amount of clear fluid and injected with half cc of dexamethasone Kenalog and applied thick plantar pad to reduce stress against the joint. Discussed possible orthotics in review again in one week

## 2013-09-15 ENCOUNTER — Ambulatory Visit (INDEPENDENT_AMBULATORY_CARE_PROVIDER_SITE_OTHER): Payer: BC Managed Care – PPO | Admitting: Podiatry

## 2013-09-15 VITALS — BP 146/86 | HR 91 | Resp 16

## 2013-09-15 DIAGNOSIS — M779 Enthesopathy, unspecified: Secondary | ICD-10-CM

## 2013-09-17 NOTE — Progress Notes (Signed)
Subjective:     Patient ID: Juan Thompson, male   DOB: 10/28/1955, 58 y.o.   MRN: [id number]  HPI patient presents stating my right foot is feeling quite a bit better but still sore 5 been on hold   Review of Systems     Objective:   Physical Exam Neurovascular status intact with diminishment of discomfort right second MPJ with inflammation and fluid still noted if standing to walk    Assessment:     Capsulitis right second MPJ still present but improved    Plan:     Advised on physical therapy anti-inflammatories and stiff bottom shoes. Reappoint if symptoms continue

## 2014-01-08 DIAGNOSIS — M79609 Pain in unspecified limb: Secondary | ICD-10-CM

## 2014-01-23 ENCOUNTER — Other Ambulatory Visit: Payer: Self-pay | Admitting: Family Medicine

## 2014-01-23 NOTE — Telephone Encounter (Signed)
Ibuprofen last filled 11/2012

## 2014-01-25 ENCOUNTER — Ambulatory Visit (INDEPENDENT_AMBULATORY_CARE_PROVIDER_SITE_OTHER): Payer: BC Managed Care – PPO | Admitting: Family Medicine

## 2014-01-25 ENCOUNTER — Encounter: Payer: Self-pay | Admitting: Family Medicine

## 2014-01-25 ENCOUNTER — Encounter (INDEPENDENT_AMBULATORY_CARE_PROVIDER_SITE_OTHER): Payer: Self-pay

## 2014-01-25 VITALS — BP 148/98 | HR 84 | Temp 98.4°F | Wt 280.0 lb

## 2014-01-25 DIAGNOSIS — Z23 Encounter for immunization: Secondary | ICD-10-CM

## 2014-01-25 DIAGNOSIS — M25569 Pain in unspecified knee: Secondary | ICD-10-CM

## 2014-01-25 DIAGNOSIS — I10 Essential (primary) hypertension: Secondary | ICD-10-CM

## 2014-01-25 MED ORDER — IBUPROFEN 800 MG PO TABS: ORAL_TABLET | ORAL | Status: DC

## 2014-01-25 MED ORDER — TRAMADOL HCL 50 MG PO TABS: 50.0000 mg | ORAL_TABLET | Freq: Four times a day (QID) | ORAL | Status: DC | PRN

## 2014-01-25 MED ORDER — CARISOPRODOL 350 MG PO TABS: ORAL_TABLET | ORAL | Status: DC

## 2014-01-25 NOTE — Progress Notes (Signed)
Pre visit review using our clinic review tool, if applicable. No additional management support is needed unless otherwise documented below in the visit note.  Hypertension:    Using medication without problems or lightheadedness: yes Chest pain with exertion:no Edema:no Short of breath:no Average home BPs: Other issues: due for labs.  Will return for fasting labs.   D/w pt about weight loss with diet and exercise.   Knee pain.  Off vicodin.  Asking about options.  Tramadol had helped some prev.  No new injury.   Meds, vitals, and allergies reviewed.   ROS: See HPI.  Otherwise, noncontributory.  GEN: nad, alert and oriented, obese HEENT: mucous membranes moist NECK: supple w/o LA CV: rrr. PULM: ctab, no inc wob ABD: soft, +bs EXT: no edema SKIN: no acute rash

## 2014-01-25 NOTE — Patient Instructions (Signed)
Lab appointment tomorrow, fasting.  Set that up on the way out.  We'll be in touch about the labs.   Try tramadol for pain.  Take care. Glad to see you.

## 2014-01-26 ENCOUNTER — Ambulatory Visit (INDEPENDENT_AMBULATORY_CARE_PROVIDER_SITE_OTHER): Payer: BC Managed Care – PPO | Admitting: Podiatry

## 2014-01-26 ENCOUNTER — Ambulatory Visit (INDEPENDENT_AMBULATORY_CARE_PROVIDER_SITE_OTHER): Payer: BC Managed Care – PPO

## 2014-01-26 ENCOUNTER — Other Ambulatory Visit (INDEPENDENT_AMBULATORY_CARE_PROVIDER_SITE_OTHER): Payer: BC Managed Care – PPO

## 2014-01-26 VITALS — BP 140/87 | HR 73 | Resp 16

## 2014-01-26 DIAGNOSIS — M779 Enthesopathy, unspecified: Secondary | ICD-10-CM

## 2014-01-26 DIAGNOSIS — I10 Essential (primary) hypertension: Secondary | ICD-10-CM

## 2014-01-26 MED ORDER — TRIAMCINOLONE ACETONIDE 10 MG/ML IJ SUSP
10.0000 mg | Freq: Once | INTRAMUSCULAR | Status: AC
  Administered 2014-01-26: 10 mg

## 2014-01-26 NOTE — Assessment & Plan Note (Signed)
See notes on labs.   We may need to address with other meds, but I want to see Cr first.  He agrees.  Flu shot today.  Return for labs.  D/w pt about weight loss.

## 2014-01-26 NOTE — Assessment & Plan Note (Addendum)
Can try prn tramadol with sedation caution. D/w pt.  He agrees. D/w pt about weight loss. Continue prn ibuprofen for now, recheck Cr tomorrow. dw pt.

## 2014-01-26 NOTE — Progress Notes (Signed)
Subjective:     Patient ID: Juan Thompson, male   DOB: 06/20/1955, 58 y.o.   MRN: [id number]  HPI patient presents stating I'm getting pain more my left foot and my right foot around the second joint and I feel at times some numbness in strange feeling around my big toe joint right   Review of Systems     Objective:   Physical Exam Neurovascular status intact with inflammation pain second metatarsophalangeal joint left over right and also mild discomfort around the right big toe but excellent range of motion 35 dorsiflexion 30 plantar flexion with no pain no crepitus within the joint    Assessment:     Probable capsulitis second MPJ left over right foot    Plan:     Recommended long-term orthotics to try to control the stress against the joint surfaces and scanned for custom orthotics for the left since it is acute I did a proximal nerve block aspirated the joint and was able to get out a small amount of clear fluid and injected with half a cc of dexamethasone Kenalog. Dispense metatarsal pads and reappoint when orthotics are ready

## 2014-01-27 LAB — LIPID PANEL
Chol/HDL Ratio: 5.5 ratio units — ABNORMAL HIGH (ref 0.0–5.0)
Cholesterol, Total: 180 mg/dL (ref 100–199)
HDL: 33 mg/dL — ABNORMAL LOW (ref >39)
LDL Calculated: 111 mg/dL — ABNORMAL HIGH (ref 0–99)
Triglycerides: 181 mg/dL — ABNORMAL HIGH (ref 0–149)
VLDL Cholesterol Cal: 36 mg/dL (ref 5–40)

## 2014-01-27 LAB — COMPREHENSIVE METABOLIC PANEL
ALT: 21 IU/L (ref 0–44)
AST: 20 IU/L (ref 0–40)
Albumin/Globulin Ratio: 2.2 (ref 1.1–2.5)
Albumin: 4.4 g/dL (ref 3.5–5.5)
Alkaline Phosphatase: 78 IU/L (ref 39–117)
BUN/Creatinine Ratio: 22 — ABNORMAL HIGH (ref 9–20)
BUN: 21 mg/dL (ref 6–24)
CO2: 26 mmol/L (ref 18–29)
Calcium: 9.1 mg/dL (ref 8.7–10.2)
Chloride: 100 mmol/L (ref 97–108)
Creatinine, Ser: 0.94 mg/dL (ref 0.76–1.27)
GFR calc Af Amer: 104 mL/min/{1.73_m2} (ref >59)
GFR calc non Af Amer: 90 mL/min/{1.73_m2} (ref >59)
Globulin, Total: 2 g/dL (ref 1.5–4.5)
Glucose: 89 mg/dL (ref 65–99)
Potassium: 4.7 mmol/L (ref 3.5–5.2)
Sodium: 141 mmol/L (ref 134–144)
Total Bilirubin: 0.5 mg/dL (ref 0.0–1.2)
Total Protein: 6.4 g/dL (ref 6.0–8.5)

## 2014-02-12 ENCOUNTER — Telehealth: Payer: Self-pay | Admitting: *Deleted

## 2014-02-12 NOTE — Telephone Encounter (Signed)
Called and left message for pt to call back to sch appt to pick up orthotics

## 2014-02-13 ENCOUNTER — Encounter: Payer: Self-pay | Admitting: *Deleted

## 2014-02-13 NOTE — Progress Notes (Signed)
SENT PT POSTCARD LETTING HIM KNOW ORTHOTICS ARE HERE.

## 2014-02-23 ENCOUNTER — Ambulatory Visit (INDEPENDENT_AMBULATORY_CARE_PROVIDER_SITE_OTHER): Payer: BC Managed Care – PPO | Admitting: *Deleted

## 2014-02-23 DIAGNOSIS — M779 Enthesopathy, unspecified: Secondary | ICD-10-CM

## 2014-02-23 NOTE — Progress Notes (Signed)
Pt presents for orthotic pick up written and verbal instructions are given 

## 2014-02-23 NOTE — Patient Instructions (Signed)

## 2014-03-19 ENCOUNTER — Other Ambulatory Visit: Payer: Self-pay | Admitting: Family Medicine

## 2014-04-03 ENCOUNTER — Other Ambulatory Visit: Payer: Self-pay

## 2014-04-03 MED ORDER — AMLODIPINE BESYLATE 5 MG PO TABS: 5.0000 mg | ORAL_TABLET | Freq: Every day | ORAL | Status: DC

## 2014-04-03 NOTE — Telephone Encounter (Signed)
Pt left v/m requesting # 5 of amlodipine to [facility] [place]. Advised done.

## 2014-04-12 ENCOUNTER — Encounter: Payer: Self-pay | Admitting: Internal Medicine

## 2014-04-12 ENCOUNTER — Ambulatory Visit (INDEPENDENT_AMBULATORY_CARE_PROVIDER_SITE_OTHER): Payer: BC Managed Care – PPO | Admitting: Internal Medicine

## 2014-04-12 VITALS — BP 152/86 | HR 76 | Temp 98.5°F | Wt 283.0 lb

## 2014-04-12 DIAGNOSIS — J069 Acute upper respiratory infection, unspecified: Secondary | ICD-10-CM

## 2014-04-12 MED ORDER — HYDROCOD POLST-CHLORPHEN POLST 10-8 MG/5ML PO LQCR: 5.0000 mL | Freq: Two times a day (BID) | ORAL | Status: DC | PRN

## 2014-04-12 MED ORDER — AZITHROMYCIN 250 MG PO TABS: ORAL_TABLET | ORAL | Status: DC

## 2014-04-12 NOTE — Progress Notes (Signed)
Pre visit review using our clinic review tool, if applicable. No additional management support is needed unless otherwise documented below in the visit note. 

## 2014-04-12 NOTE — Patient Instructions (Signed)
Cough, Adult  A cough is a reflex that helps clear your throat and airways. It can help heal the body or may be a reaction to an irritated airway. A cough may only last 2 or 3 weeks (acute) or may last more than 8 weeks (chronic).  CAUSES Acute cough:  Viral or bacterial infections. Chronic cough:  Infections.  Allergies.  Asthma.  Post-nasal drip.  Smoking.  Heartburn or acid reflux.  Some medicines.  Chronic lung problems (COPD).  Cancer. SYMPTOMS   Cough.  Fever.  Chest pain.  Increased breathing rate.  High-pitched whistling sound when breathing (wheezing).  Colored mucus that you cough up (sputum). TREATMENT   A bacterial cough may be treated with antibiotic medicine.  A viral cough must run its course and will not respond to antibiotics.  Your caregiver may recommend other treatments if you have a chronic cough. HOME CARE INSTRUCTIONS   Only take over-the-counter or prescription medicines for pain, discomfort, or fever as directed by your caregiver. Use cough suppressants only as directed by your caregiver.  Use a cold steam vaporizer or humidifier in your bedroom or home to help loosen secretions.  Sleep in a semi-upright position if your cough is worse at night.  Rest as needed.  Stop smoking if you smoke. SEEK IMMEDIATE MEDICAL CARE IF:   You have pus in your sputum.  Your cough starts to worsen.  You cannot control your cough with suppressants and are losing sleep.  You begin coughing up blood.  You have difficulty breathing.  You develop pain which is getting worse or is uncontrolled with medicine.  You have a fever. MAKE SURE YOU:   Understand these instructions.  Will watch your condition.  Will get help right away if you are not doing well or get worse. Document Released: 10/24/2010 Document Revised: 07/20/2011 Document Reviewed: 10/24/2010 ExitCare Patient Information 2015 [employer]. This information is not intended  to replace advice given to you by your health care provider. Make sure you discuss any questions you have with your health care provider.  

## 2014-04-12 NOTE — Progress Notes (Signed)
HPI  Pt presents to the clinic today with c/o headache, runny nose, cough and chest congestion. He reports this started 4 days ago. The cough is productive of brown mucous. He has run fevers up to 103. He has tried Mucinex and Ibuprofen without any relief. He has no history of allergies. He has not had sick contacts. He does not smoke.  Review of Systems      Past Medical History  Diagnosis Date  . Sleep apnea 04/17/05    mild, sleep study  . Hypercholesteremia     mild  . History of MRI of cervical spine 04/17/05    without DDD 4/5, bulg 5/6, narrowing 5/6  . Neck pain     with soma use for spasm  . Hypertension   . ED (erectile dysfunction)   . Arthritis     L knee, occ vicodin use    Family History  Problem Relation Age of Onset  . Hypertension Mother   . Heart disease Mother     stent  . Thyroid disease Mother   . Heart disease Father     CAD stent  . Cancer Father     skin cancer  . Hypertension Brother   . Parkinsonism Paternal Grandmother   . Diabetes Other   . Colon cancer Neg Hx   . Prostate cancer Neg Hx     History   Social History  . Marital Status: Married    Spouse Name: N/A    Number of Children: N/A  . Years of Education: N/A   Occupational History  . Furniture conservator/restorer for [employer] History Main Topics  . Smoking status: Former Research scientist (life sciences)  . Smokeless tobacco: Never Used     Comment: quit in ~ 2010 with Chantix  . Alcohol Use: Yes     Comment: occ on weekend, rare  . Drug Use: No  . Sexual Activity: Not on file   Other Topics Concern  . 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 Codeine     REACTION: dizziness     Constitutional: Positive headache, fatigue and fever. Denies abrupt weight changes.  HEENT:  Positive sore throat. Denies eye redness, eye pain, pressure  behind the eyes, facial pain, nasal congestion, ear pain, ringing in the ears, wax buildup, runny nose or bloody nose. Respiratory: Positive cough. Denies difficulty breathing or shortness of breath.  Cardiovascular: Denies chest pain, chest tightness, palpitations or swelling in the hands or feet.   No other specific complaints in a complete review of systems (except as listed in HPI above).  Objective:   BP 152/86 mmHg  Pulse 76  Temp(Src) 98.5 F (36.9 C) (Oral)  Wt 283 lb (128.368 kg)  SpO2 97% Wt Readings from Last 3 Encounters:  04/12/14 283 lb (128.368 kg)  01/25/14 280 lb (127.007 kg)  09/08/13 270 lb (122.471 kg)     General: Appears his stated age, ill appearing in NAD. HEENT: Head: normal shape and size; Ears: Tm's gray and intact, normal light reflex; Nose: mucosa pink and moist, septum midline; Throat/Mouth:  Teeth present, mucosa erythematous and moist, no exudate noted, no lesions or ulcerations noted.  Cardiovascular: Normal rate and rhythm. S1,S2 noted.  No murmur, rubs or gallops noted.  Pulmonary/Chest: Normal effort and coarse vesicular breath  sounds. No respiratory distress. No wheezes, rales or ronchi noted.      Assessment & Plan:   Upper Respiratory Infection with Cough:  Get some rest and drink plenty of water Ibuprofen for fever Do salt water gargles for the sore throat eRx for Azithromax x 5 days eRx for Tussionex cough syrup  RTC as needed or if symptoms persist.

## 2014-04-24 ENCOUNTER — Other Ambulatory Visit: Payer: Self-pay | Admitting: Family Medicine

## 2014-04-24 NOTE — Telephone Encounter (Signed)
Electronic refill request. Last Filled:    40 tablet 2RF on 01/25/2014  Please advise.

## 2014-04-24 NOTE — Telephone Encounter (Signed)
Please call in.  Thanks.   

## 2014-04-25 ENCOUNTER — Other Ambulatory Visit: Payer: Self-pay | Admitting: Family Medicine

## 2014-04-25 NOTE — Telephone Encounter (Signed)
Pt left v/m requesting status of tramadol refill. 

## 2014-04-25 NOTE — Telephone Encounter (Signed)
Patient notified that script has been called to pharmacy.

## 2014-04-30 ENCOUNTER — Other Ambulatory Visit: Payer: Self-pay | Admitting: Family Medicine

## 2014-04-30 NOTE — Telephone Encounter (Signed)
Electronic refill request. Last Filled:    30 tablet 2 RF on 01/25/2014  Please advise.

## 2014-05-01 NOTE — Telephone Encounter (Signed)
Medication phoned to pharmacy.  

## 2014-05-01 NOTE — Telephone Encounter (Signed)
Please call in.  Thanks.   

## 2014-06-14 ENCOUNTER — Encounter: Payer: Self-pay | Admitting: Family Medicine

## 2014-06-14 ENCOUNTER — Ambulatory Visit (INDEPENDENT_AMBULATORY_CARE_PROVIDER_SITE_OTHER): Payer: BLUE CROSS/BLUE SHIELD | Admitting: Family Medicine

## 2014-06-14 VITALS — BP 140/78 | HR 83 | Temp 98.5°F | Wt 284.5 lb

## 2014-06-14 DIAGNOSIS — R05 Cough: Secondary | ICD-10-CM

## 2014-06-14 DIAGNOSIS — R059 Cough, unspecified: Secondary | ICD-10-CM

## 2014-06-14 MED ORDER — HYDROCOD POLST-CHLORPHEN POLST 10-8 MG/5ML PO LQCR: 5.0000 mL | Freq: Two times a day (BID) | ORAL | Status: DC

## 2014-06-14 MED ORDER — HYDROCODONE-HOMATROPINE 5-1.5 MG/5ML PO SYRP: 5.0000 mL | ORAL_SOLUTION | Freq: Three times a day (TID) | ORAL | Status: DC | PRN

## 2014-06-14 MED ORDER — DOXYCYCLINE HYCLATE 100 MG PO TABS: 100.0000 mg | ORAL_TABLET | Freq: Two times a day (BID) | ORAL | Status: DC

## 2014-06-14 NOTE — Progress Notes (Signed)
Pre visit review using our clinic review tool, if applicable. No additional management support is needed unless otherwise documented below in the visit note.  duration of symptoms: about 1 week ago, worse in the meantime.  Rhinorrhea: yes Congestion: yes ear pain: no sore throat: yes Cough: yes Myalgias: no other concerns: he's still looking for work.   No fevers, no chills.   Discolored sputum with the cough.  He asked about a WASP rx for abx in this case.   Sleeping propped up at night, to try to get some rest.    ROS: See HPI.  Otherwise negative.    Meds, vitals, and allergies reviewed.   GEN: nad, alert and oriented HEENT: mucous membranes moist, TM w/o erythema, nasal epithelium injected, OP with cobblestoning NECK: supple w/o LA CV: rrr. PULM: ctab, no inc wob ABD: soft, +bs EXT: no edema

## 2014-06-14 NOTE — Patient Instructions (Signed)
Start the doxycycline in a few days if not better. Use the cough medicine in the meantime.  Take care. Glad to see you.

## 2014-06-15 NOTE — Assessment & Plan Note (Signed)
Initial rx for hycodan shredded, change to tussionex.  Hold doxy for now, fill if not better. He agrees. Nontoxic, f/u prn.

## 2014-06-25 ENCOUNTER — Other Ambulatory Visit: Payer: Self-pay | Admitting: Family Medicine

## 2014-06-26 NOTE — Telephone Encounter (Signed)
Electronic refill request. Last Filled:   30 tablet 0 RF on 05/01/2014.  Please advise.

## 2014-06-26 NOTE — Telephone Encounter (Signed)
Please call in.  Thanks.   

## 2014-06-27 NOTE — Telephone Encounter (Signed)
Rx called in to pharmacy. 

## 2014-07-10 ENCOUNTER — Encounter: Payer: Self-pay | Admitting: Family Medicine

## 2014-07-11 ENCOUNTER — Other Ambulatory Visit: Payer: Self-pay | Admitting: Family Medicine

## 2014-07-11 ENCOUNTER — Encounter: Payer: Self-pay | Admitting: Family Medicine

## 2014-07-11 DIAGNOSIS — I10 Essential (primary) hypertension: Secondary | ICD-10-CM

## 2014-07-11 MED ORDER — LISINOPRIL 10 MG PO TABS: 10.0000 mg | ORAL_TABLET | Freq: Every day | ORAL | Status: DC

## 2014-07-12 ENCOUNTER — Telehealth: Payer: Self-pay | Admitting: Family Medicine

## 2014-07-12 NOTE — Telephone Encounter (Signed)
emmi emailed °

## 2014-07-19 ENCOUNTER — Other Ambulatory Visit: Payer: Self-pay | Admitting: Family Medicine

## 2014-07-19 NOTE — Telephone Encounter (Signed)
Electronic refill request. Last Filled:    40 tablet 2 04/24/2014  Please advise.

## 2014-07-20 ENCOUNTER — Other Ambulatory Visit: Payer: Self-pay | Admitting: Family Medicine

## 2014-07-20 NOTE — Telephone Encounter (Signed)
Medication phoned to pharmacy.  

## 2014-07-20 NOTE — Telephone Encounter (Signed)
Please call in.  Thanks.   

## 2014-07-26 ENCOUNTER — Other Ambulatory Visit: Payer: Self-pay | Admitting: Family Medicine

## 2014-08-09 ENCOUNTER — Other Ambulatory Visit: Payer: Self-pay | Admitting: Family Medicine

## 2014-08-09 NOTE — Telephone Encounter (Signed)
Electronic refill request. Last Filled:    30 tablet 1 06/26/2014  Please advise.

## 2014-08-10 NOTE — Telephone Encounter (Signed)
Please call in.  Thanks.   

## 2014-08-10 NOTE — Telephone Encounter (Signed)
Medication phoned to pharmacy.  

## 2014-10-10 DIAGNOSIS — B351 Tinea unguium: Secondary | ICD-10-CM

## 2014-10-22 ENCOUNTER — Other Ambulatory Visit: Payer: Self-pay | Admitting: Family Medicine

## 2014-10-22 NOTE — Telephone Encounter (Signed)
Electronic refill request. Last Filled:    40 tablet 2 RF on 07/20/2014  Please advise.

## 2014-10-22 NOTE — Telephone Encounter (Signed)
Please call in.  Thanks.   

## 2014-10-23 ENCOUNTER — Other Ambulatory Visit: Payer: Self-pay | Admitting: Family Medicine

## 2014-10-23 NOTE — Telephone Encounter (Signed)
Medication phoned to pharmacy.  

## 2014-10-24 ENCOUNTER — Telehealth: Payer: Self-pay | Admitting: *Deleted

## 2014-10-24 NOTE — Telephone Encounter (Signed)
LEFT MESSAGE FOR PATIENT THAT HIS SECOND PAIR OF ORTHOTICS ARE HERE

## 2015-01-22 ENCOUNTER — Other Ambulatory Visit: Payer: Self-pay | Admitting: Family Medicine

## 2015-01-22 NOTE — Telephone Encounter (Signed)
Ok to refill 

## 2015-01-22 NOTE — Telephone Encounter (Signed)
Please call in.  Thanks.   

## 2015-01-23 ENCOUNTER — Other Ambulatory Visit: Payer: Self-pay | Admitting: Family Medicine

## 2015-01-23 NOTE — Telephone Encounter (Signed)
Rx's called in as directed.  

## 2015-02-23 ENCOUNTER — Other Ambulatory Visit: Payer: Self-pay | Admitting: Family Medicine

## 2015-02-25 ENCOUNTER — Ambulatory Visit (INDEPENDENT_AMBULATORY_CARE_PROVIDER_SITE_OTHER): Payer: BLUE CROSS/BLUE SHIELD

## 2015-02-25 DIAGNOSIS — Z23 Encounter for immunization: Secondary | ICD-10-CM | POA: Diagnosis not present

## 2015-04-05 ENCOUNTER — Other Ambulatory Visit: Payer: Self-pay | Admitting: Family Medicine

## 2015-04-07 NOTE — Telephone Encounter (Signed)
Please call in.  Thanks.   

## 2015-04-08 NOTE — Telephone Encounter (Signed)
Rx called to pharmacy as instructed. 

## 2015-05-02 ENCOUNTER — Other Ambulatory Visit: Payer: Self-pay | Admitting: Family Medicine

## 2015-05-02 NOTE — Telephone Encounter (Signed)
Rx called in as prescribed 

## 2015-05-02 NOTE — Telephone Encounter (Signed)
Electronic refill request, Juan Thompson last refilled on 01/22/15 #30 with 1 additional refill, Ibuprofen last refilled on 02/25/15 #90 with 0 refills, Dr. Damita Dunnings out of the office please advise

## 2015-07-08 ENCOUNTER — Other Ambulatory Visit: Payer: Self-pay

## 2015-07-08 NOTE — Telephone Encounter (Signed)
Pt left v/m requesting refill carisoprodol to [facility]; last refilled # 30 on 05/02/15; last med refill appt 01/25/14. No future appt scheduled. Pt request cb.

## 2015-07-09 MED ORDER — CARISOPRODOL 350 MG PO TABS: ORAL_TABLET | ORAL | Status: DC

## 2015-07-09 NOTE — Telephone Encounter (Signed)
Medication phoned to pharmacy.  

## 2015-07-09 NOTE — Telephone Encounter (Signed)
Please call in.  Thanks.   

## 2015-07-19 ENCOUNTER — Other Ambulatory Visit: Payer: Self-pay | Admitting: Family Medicine

## 2015-07-19 NOTE — Telephone Encounter (Signed)
Electronic refill request. Last Filled:     40 tablet 2 04/07/2015  Last office visit:   06/14/14 acute.  No upcoming appts scheduled.  Please advise.

## 2015-07-21 NOTE — Telephone Encounter (Signed)
Please call in.  Due for CPE when possible.  Thanks.

## 2015-07-22 ENCOUNTER — Other Ambulatory Visit: Payer: Self-pay | Admitting: Family Medicine

## 2015-07-22 NOTE — Telephone Encounter (Signed)
Left detailed message on voicemail.  

## 2015-07-22 NOTE — Telephone Encounter (Signed)
Medication phoned to pharmacy.  

## 2015-09-09 ENCOUNTER — Other Ambulatory Visit: Payer: Self-pay | Admitting: Family Medicine

## 2015-09-09 NOTE — Telephone Encounter (Signed)
Last office visit 06/14/2014.  CPE scheduled for June.  Last refilled 05/02/2015 for #90 with no refills.  Ok to refill?

## 2015-09-09 NOTE — Telephone Encounter (Signed)
Sent, since CPE scheduled for June.  Thanks.

## 2015-09-13 ENCOUNTER — Other Ambulatory Visit: Payer: Self-pay | Admitting: Family Medicine

## 2015-09-16 NOTE — Telephone Encounter (Signed)
Please call in.  Thanks.   

## 2015-09-16 NOTE — Telephone Encounter (Addendum)
Received refill electronically Last refill 07/09/15 30/1 Last office visit 06/14/14/acute Upcoming appointment scheduled 10/17/15

## 2015-09-17 NOTE — Telephone Encounter (Signed)
Medication phoned to pharmacy.  

## 2015-10-07 ENCOUNTER — Other Ambulatory Visit: Payer: Self-pay | Admitting: Family Medicine

## 2015-10-08 ENCOUNTER — Other Ambulatory Visit: Payer: Self-pay | Admitting: Family Medicine

## 2015-10-08 DIAGNOSIS — I10 Essential (primary) hypertension: Secondary | ICD-10-CM

## 2015-10-08 NOTE — Telephone Encounter (Signed)
Electronic refill request. Last Filled:    40 tablet 2 07/21/2015  Last office visit:   Upcoming appt later this week for CPE.  Please advise.

## 2015-10-09 ENCOUNTER — Other Ambulatory Visit: Payer: Self-pay | Admitting: Family Medicine

## 2015-10-09 NOTE — Telephone Encounter (Signed)
Please call in.  Thanks.   

## 2015-10-09 NOTE — Telephone Encounter (Signed)
Rx called to pharmacy as instructed. 

## 2015-10-10 ENCOUNTER — Other Ambulatory Visit (INDEPENDENT_AMBULATORY_CARE_PROVIDER_SITE_OTHER): Payer: BLUE CROSS/BLUE SHIELD

## 2015-10-10 DIAGNOSIS — I10 Essential (primary) hypertension: Secondary | ICD-10-CM

## 2015-10-10 DIAGNOSIS — Z1159 Encounter for screening for other viral diseases: Secondary | ICD-10-CM

## 2015-10-14 ENCOUNTER — Other Ambulatory Visit: Payer: Self-pay | Admitting: Family Medicine

## 2015-10-17 ENCOUNTER — Ambulatory Visit (INDEPENDENT_AMBULATORY_CARE_PROVIDER_SITE_OTHER): Payer: BLUE CROSS/BLUE SHIELD | Admitting: Family Medicine

## 2015-10-17 ENCOUNTER — Encounter: Payer: Self-pay | Admitting: Family Medicine

## 2015-10-17 VITALS — BP 132/78 | HR 81 | Temp 98.0°F | Ht 68.0 in | Wt 277.5 lb

## 2015-10-17 DIAGNOSIS — Z23 Encounter for immunization: Secondary | ICD-10-CM | POA: Diagnosis not present

## 2015-10-17 DIAGNOSIS — Z1211 Encounter for screening for malignant neoplasm of colon: Secondary | ICD-10-CM

## 2015-10-17 DIAGNOSIS — Z0000 Encounter for general adult medical examination without abnormal findings: Secondary | ICD-10-CM

## 2015-10-17 DIAGNOSIS — Z119 Encounter for screening for infectious and parasitic diseases, unspecified: Secondary | ICD-10-CM

## 2015-10-17 DIAGNOSIS — I10 Essential (primary) hypertension: Secondary | ICD-10-CM

## 2015-10-17 DIAGNOSIS — M25569 Pain in unspecified knee: Secondary | ICD-10-CM

## 2015-10-17 DIAGNOSIS — Z125 Encounter for screening for malignant neoplasm of prostate: Secondary | ICD-10-CM

## 2015-10-17 MED ORDER — LISINOPRIL 10 MG PO TABS: ORAL_TABLET | ORAL | Status: DC

## 2015-10-17 MED ORDER — IBUPROFEN 800 MG PO TABS: ORAL_TABLET | ORAL | Status: DC

## 2015-10-17 NOTE — Patient Instructions (Addendum)
Go to the lab on the way out.  We'll contact you with your lab report. Take care.  Glad to see you.  Update me as needed.   

## 2015-10-17 NOTE — Progress Notes (Signed)
Pre visit review using our clinic review tool, if applicable. No additional management support is needed unless otherwise documented below in the visit note.  CPE- See plan.  Routine anticipatory guidance given to patient.  See health maintenance. Tetanus 2017 PNA 2012 Flu yearly Shingles shot due at 61.  D/w pt.   Prostate cancer screening and PSA options (with potential risks and benefits of testing vs not testing) were discussed along with recent recs/guidelines. He accepted testing PSA with next set of labs. D/w patient [id number] for colon cancer screening, including IFOB vs. colonoscopy. Risks and benefits of both were discussed and patient voiced understanding. Pt elects [id number].  Advance directive d/w pt. Would have his wife designated if incapacitated. Encouraged living will.  Pt opts in for HIV screening with next set of labs.  D/w pt re: routine screening.   HCV neg.  dw pt.    Arthritis.  Hand, knee, hip and back pain. When back pain is worse, his R knee pain gets worse.  No sciatica pain.  Muscle relaxers and ibuprofen help his knee a lot when used.  He isn't drowsy on the med but he doesn't sleep as well with the meds.  It isn't worth changing meds at this point, per patient.    Hypertension:    Using medication without problems or lightheadedness: yes Chest pain with exertion:no Edema:no Short of breath:no Average home BPs: usually controlled, occ needing 20mg  of lisinopril.  Likely BP elevation is related to pain flares.   Diet and exercise d/w pt.  Some weight loss.  Weight d/w pt.  He is trying to make diet changes/improvement.    PMH and SH reviewed  Meds, vitals, and allergies reviewed.   ROS: Per HPI.  Unless specifically indicated otherwise in HPI, the patient denies:  General: fever. Eyes: acute vision changes ENT: sore throat Cardiovascular: chest pain Respiratory: SOB GI: vomiting GU: dysuria Musculoskeletal: acute back pain Derm: acute  rash Neuro: acute motor dysfunction Psych: worsening mood Endocrine: polydipsia Heme: bleeding Allergy: hayfever   GEN: nad, alert and oriented HEENT: mucous membranes moist NECK: supple w/o LA CV: rrr. PULM: ctab, no inc wob ABD: soft, +bs EXT: no edema SKIN: no acute rash

## 2015-10-18 NOTE — Assessment & Plan Note (Signed)
Tetanus 2017  PNA 2012  Flu yearly  Shingles shot due at 61. D/w pt.  Prostate cancer screening and PSA options (with potential risks and benefits of testing vs not testing) were discussed along with recent recs/guidelines. He accepted testing PSA with next set of labs.  D/w patient [id number] for colon cancer screening, including IFOB vs. colonoscopy. Risks and benefits of both were discussed and patient voiced understanding. Pt elects [id number].  Advance directive d/w pt. Would have his wife designated if incapacitated. Encouraged living will.  Pt opts in for HIV screening with next set of labs. D/w pt re: routine screening.  HCV neg. dw pt.

## 2015-10-18 NOTE — Assessment & Plan Note (Signed)
Controlled, continue work on diet and exercise.  He agrees.

## 2015-10-18 NOTE — Assessment & Plan Note (Signed)
Muscle relaxers and ibuprofen help his knee a lot when used. He isn't drowsy on the med but he doesn't sleep as well with the meds.  It isn't worth changing meds at this point, per patient.  He'll update me as needed, depending on his condition and pain level.

## 2015-11-01 ENCOUNTER — Other Ambulatory Visit (INDEPENDENT_AMBULATORY_CARE_PROVIDER_SITE_OTHER): Payer: BLUE CROSS/BLUE SHIELD

## 2015-11-01 DIAGNOSIS — Z1211 Encounter for screening for malignant neoplasm of colon: Secondary | ICD-10-CM | POA: Diagnosis not present

## 2015-11-04 LAB — FECAL OCCULT BLOOD, IMMUNOCHEMICAL: Fecal Occult Bld: NEGATIVE

## 2015-11-05 ENCOUNTER — Encounter: Payer: Self-pay | Admitting: *Deleted

## 2015-11-20 LAB — COMPREHENSIVE METABOLIC PANEL
ALT: 21 IU/L (ref 0–44)
AST: 19 IU/L (ref 0–40)
Albumin/Globulin Ratio: 1.8 (ref 1.2–2.2)
Albumin: 4.2 g/dL (ref 3.5–5.5)
Alkaline Phosphatase: 74 IU/L (ref 39–117)
BUN/Creatinine Ratio: 21 — ABNORMAL HIGH (ref 9–20)
BUN: 20 mg/dL (ref 6–24)
Bilirubin Total: 0.4 mg/dL (ref 0.0–1.2)
CO2: 24 mmol/L (ref 18–29)
Calcium: 9 mg/dL (ref 8.7–10.2)
Chloride: 103 mmol/L (ref 96–106)
Creatinine, Ser: 0.94 mg/dL (ref 0.76–1.27)
GFR calc Af Amer: 103 mL/min/{1.73_m2} (ref >59)
GFR calc non Af Amer: 89 mL/min/{1.73_m2} (ref >59)
Globulin, Total: 2.4 g/dL (ref 1.5–4.5)
Glucose: 103 mg/dL — ABNORMAL HIGH (ref 65–99)
Potassium: 4.8 mmol/L (ref 3.5–5.2)
Sodium: 143 mmol/L (ref 134–144)
Total Protein: 6.6 g/dL (ref 6.0–8.5)

## 2015-11-20 LAB — LIPID PANEL
Chol/HDL Ratio: 5.2 ratio units — ABNORMAL HIGH (ref 0.0–5.0)
Cholesterol, Total: 194 mg/dL (ref 100–199)
HDL: 37 mg/dL — ABNORMAL LOW (ref >39)
LDL Calculated: 130 mg/dL — ABNORMAL HIGH (ref 0–99)
Triglycerides: 135 mg/dL (ref 0–149)
VLDL Cholesterol Cal: 27 mg/dL (ref 5–40)

## 2015-11-20 LAB — HEPATITIS C ANTIBODY: Hep C Virus Ab: <0.1 s/co ratio (ref 0.0–0.9)

## 2015-12-05 ENCOUNTER — Other Ambulatory Visit: Payer: Self-pay | Admitting: Family Medicine

## 2015-12-05 NOTE — Telephone Encounter (Signed)
Medication phoned to pharmacy.  

## 2015-12-05 NOTE — Telephone Encounter (Signed)
Please call in.  Thanks.   

## 2015-12-11 ENCOUNTER — Other Ambulatory Visit: Payer: Self-pay | Admitting: Family Medicine

## 2015-12-11 NOTE — Telephone Encounter (Signed)
Please call in.  Thanks.   

## 2015-12-11 NOTE — Telephone Encounter (Signed)
Rx called to pharmacy as instructed. 

## 2015-12-18 ENCOUNTER — Ambulatory Visit (INDEPENDENT_AMBULATORY_CARE_PROVIDER_SITE_OTHER): Payer: BLUE CROSS/BLUE SHIELD | Admitting: Family Medicine

## 2015-12-18 ENCOUNTER — Ambulatory Visit: Payer: BLUE CROSS/BLUE SHIELD | Admitting: Family Medicine

## 2015-12-18 ENCOUNTER — Encounter: Payer: Self-pay | Admitting: Family Medicine

## 2015-12-18 VITALS — BP 136/84 | HR 84 | Temp 98.9°F | Ht 68.0 in | Wt 286.8 lb

## 2015-12-18 DIAGNOSIS — B9789 Other viral agents as the cause of diseases classified elsewhere: Principal | ICD-10-CM

## 2015-12-18 DIAGNOSIS — J069 Acute upper respiratory infection, unspecified: Secondary | ICD-10-CM | POA: Diagnosis not present

## 2015-12-18 MED ORDER — HYDROCOD POLST-CPM POLST ER 10-8 MG/5ML PO SUER: 5.0000 mL | Freq: Two times a day (BID) | ORAL | 0 refills | Status: DC | PRN

## 2015-12-18 MED ORDER — HYDROCOD POLST-CPM POLST ER 10-8 MG/5ML PO SUER
5.0000 mL | Freq: Two times a day (BID) | ORAL | 0 refills | Status: DC | PRN
Start: 2015-12-18 — End: 2015-12-18

## 2015-12-18 NOTE — Progress Notes (Addendum)
Subjective:    Patient ID: Juan Thompson, male    DOB: December 06, 1955, 60 y.o.   MRN: [id number]  HPI This is a 60 yo male who presents today with cough x 4 days, started with sore throat. Cough productive of yellow mucous, similar nasal congestion. No ear pain, subjective fever, mild headache, no muscle or joint aches. Some upper airway noise that clears with coughing, no wheezing. Has seasonal allergies Currently taking amoxicillin for tooth infection. Has tried Delsym for cough without relief. Takes ibuprofen 800 mg twice a day for musculoskeletal pain. Helps some with throat pain. Has been around grandchildren who had runny noses. Distant smoking history, no history asthma. Has had good relief with Tussionex in the past.   Past Medical History:  Diagnosis Date  . Arthritis    L knee, occ vicodin use  . ED (erectile dysfunction)   . History of MRI of cervical spine 04/17/05   without DDD 4/5, bulg 5/6, narrowing 5/6  . Hypercholesteremia    mild  . Hypertension   . Neck pain    with soma use for spasm  . Sleep apnea 04/17/05   mild, sleep study   Past Surgical History:  Procedure Laterality Date  . therapeutic nasal fracture  1976   Family History  Problem Relation Age of Onset  . Hypertension Mother   . Heart disease Mother   . Thyroid disease Mother   . Heart disease Father     CAD stent  . Cancer Father     skin cancer  . Hypertension Brother   . Parkinsonism Paternal Grandmother   . Diabetes Other   . Colon cancer Neg Hx   . Prostate cancer Neg Hx    Social History  Substance Use Topics  . Smoking status: Former Research scientist (life sciences)  . Smokeless tobacco: Never Used     Comment: quit in ~ 2010 with Chantix  . Alcohol use Yes     Comment: occ on weekend, rare      Review of Systems Per HPI    Objective:   Physical Exam Physical Exam  Constitutional: Oriented to person, place, and time. He appears well-developed and well-nourished. Obese.  HENT:  Head: Normocephalic  and atraumatic.  Eyes: Conjunctivae are normal.  Ears: Canals clear, bilateral TMs normal.  Neck: Normal range of motion. Neck supple.  Nose: mucosal edema Throat: moderate amount post nasal drainage Cardiovascular: Normal rate, regular rhythm and normal heart sounds.   Pulmonary/Chest: Effort normal and breath sounds normal.  Musculoskeletal: Normal range of motion.  Neurological: Alert and oriented to person, place, and time.  Skin: Skin is warm and dry.  Psychiatric: Normal mood and affect. Behavior is normal. Judgment and thought content normal.  Vitals reviewed.  BP 136/84 (BP Location: Left Arm, Patient Position: Sitting, Cuff Size: Large)   Pulse 84   Temp 98.9 F (37.2 C) (Oral)   Ht 5\' 8"  (1.727 m)   Wt 286 lb 12.8 oz (130.1 kg)   SpO2 96%   BMI 43.61 kg/m      Assessment & Plan:  1. Viral URI with cough - suspect viral illness, instructed him to continue current amoxicillin, advised on symptomatic treatment, return to clinic precautions reviewed.  - chlorpheniramine-HYDROcodone (TUSSIONEX PENNKINETIC ER) 10-8 MG/5ML SUER; Take 5 mLs by mouth every 12 (twelve) hours as needed for cough.  Dispense: 140 mL; Refill: 0  Clarene Reamer, FNP-BC  [facility]   12/18/2015 9:49  AM

## 2015-12-18 NOTE — Patient Instructions (Signed)
Continue your amoxicillin until finished.   Please get a thermometer to monitor your temperature when you are sick. For muscle aches, headache, sore throat you can take over the counter acetaminophen or ibuprofen as directed on the package For nasal congestion you can use Afrin nasal spray twice a day for up to 3 days, and /or sudafed, and/or saline nasal spray For cough you can use Delsym cough syrup Please come back to see [place] or go to the emergency department if you are not better in 5 to 7 days or if you develop fever over 101 for more than 48 hours or if you develop wheezing or shortness of breath.   Cough, Adult Coughing is a reflex that clears your throat and your airways. Coughing helps to heal and protect your lungs. It is normal to cough occasionally, but a cough that happens with other symptoms or lasts a long time may be a sign of a condition that needs treatment. A cough may last only 2-3 weeks (acute), or it may last longer than 8 weeks (chronic). CAUSES Coughing is commonly caused by:  Breathing in substances that irritate your lungs.  A viral or bacterial respiratory infection.  Allergies.  Asthma.  Postnasal drip.  Smoking.  Acid backing up from the stomach into the esophagus (gastroesophageal reflux).  Certain medicines.  Chronic lung problems, including COPD (or rarely, lung cancer).  Other medical conditions such as heart failure. HOME CARE INSTRUCTIONS  Pay attention to any changes in your symptoms. Take these actions to help with your discomfort:  Take medicines only as told by your health care provider.  If you were prescribed an antibiotic medicine, take it as told by your health care provider. Do not stop taking the antibiotic even if you start to feel better.  Talk with your health care provider before you take a cough suppressant medicine.  Drink enough fluid to keep your urine clear or pale yellow.  If the air is dry, use a cold steam vaporizer or  humidifier in your bedroom or your home to help loosen secretions.  Avoid anything that causes you to cough at work or at home.  If your cough is worse at night, try sleeping in a semi-upright position.  Avoid cigarette smoke. If you smoke, quit smoking. If you need help quitting, ask your health care provider.  Avoid caffeine.  Avoid alcohol.  Rest as needed. SEEK MEDICAL CARE IF:   You have new symptoms.  You cough up pus.  Your cough does not get better after 2-3 weeks, or your cough gets worse.  You cannot control your cough with suppressant medicines and you are losing sleep.  You develop pain that is getting worse or pain that is not controlled with pain medicines.  You have a fever.  You have unexplained weight loss.  You have night sweats. SEEK IMMEDIATE MEDICAL CARE IF:  You cough up blood.  You have difficulty breathing.  Your heartbeat is very fast.   This information is not intended to replace advice given to you by your health care provider. Make sure you discuss any questions you have with your health care provider.   Document Released: 10/24/2010 Document Revised: 01/16/2015 Document Reviewed: 07/04/2014 Elsevier Interactive Patient Education [employer].

## 2015-12-27 ENCOUNTER — Other Ambulatory Visit: Payer: Self-pay | Admitting: Family Medicine

## 2015-12-29 NOTE — Telephone Encounter (Signed)
Sent. Thanks.   

## 2016-01-20 ENCOUNTER — Encounter: Payer: Self-pay | Admitting: Family Medicine

## 2016-01-20 ENCOUNTER — Ambulatory Visit (INDEPENDENT_AMBULATORY_CARE_PROVIDER_SITE_OTHER): Payer: BLUE CROSS/BLUE SHIELD | Admitting: Family Medicine

## 2016-01-20 DIAGNOSIS — R05 Cough: Secondary | ICD-10-CM

## 2016-01-20 DIAGNOSIS — J069 Acute upper respiratory infection, unspecified: Secondary | ICD-10-CM | POA: Diagnosis not present

## 2016-01-20 DIAGNOSIS — R059 Cough, unspecified: Secondary | ICD-10-CM

## 2016-01-20 DIAGNOSIS — B9789 Other viral agents as the cause of diseases classified elsewhere: Principal | ICD-10-CM

## 2016-01-20 MED ORDER — HYDROCOD POLST-CPM POLST ER 10-8 MG/5ML PO SUER: 5.0000 mL | Freq: Two times a day (BID) | ORAL | 0 refills | Status: DC | PRN

## 2016-01-20 MED ORDER — DOXYCYCLINE HYCLATE 100 MG PO TABS: 100.0000 mg | ORAL_TABLET | Freq: Two times a day (BID) | ORAL | 0 refills | Status: DC

## 2016-01-20 NOTE — Progress Notes (Signed)
Raw feeling in chest, ST.  Then progressive sx in the last few days.  More cough.  Sputum, discolored.  Sx started about 1 week ago.   Prev with relief with tussionex, no ADE.    He was laid off recently.  He has a job interview pending.    Meds, vitals, and allergies reviewed.   ROS: Per HPI unless specifically indicated in ROS section   GEN: nad, alert and oriented HEENT: mucous membranes moist, tm w/o erythema, nasal exam w/o erythema, clear discharge noted,  OP with cobblestoning NECK: supple w/o LA CV: rrr.   PULM: ctab, no inc wob EXT: no edema SKIN: no acute rash

## 2016-01-20 NOTE — Patient Instructions (Addendum)
Use the cough medicine and start the antibiotics in a few days if not better.   Take care.  Glad to see you.  Update me as needed.

## 2016-01-20 NOTE — Progress Notes (Signed)
Pre visit review using our clinic review tool, if applicable. No additional management support is needed unless otherwise documented below in the visit note. 

## 2016-01-21 NOTE — Assessment & Plan Note (Signed)
Likely viral. Nontoxic. Differential diagnosis discussed with patient. Okay for outpatient follow-up. Use cough medicine with routine cautions for now. If not better in a few days then start antibiotics. I suspect he will not need antibiotics. He agrees.

## 2016-03-11 ENCOUNTER — Other Ambulatory Visit: Payer: Self-pay | Admitting: Family Medicine

## 2016-03-11 NOTE — Telephone Encounter (Signed)
Received refill electronically Last refill 12/11/15 #40/2 Last office visit 01/20/16/acute

## 2016-03-12 NOTE — Telephone Encounter (Signed)
Please call in.  Thanks.   

## 2016-03-12 NOTE — Telephone Encounter (Signed)
Medication phoned to pharmacy.  

## 2016-04-06 ENCOUNTER — Ambulatory Visit (INDEPENDENT_AMBULATORY_CARE_PROVIDER_SITE_OTHER): Payer: BLUE CROSS/BLUE SHIELD | Admitting: Family Medicine

## 2016-04-06 ENCOUNTER — Encounter: Payer: Self-pay | Admitting: Family Medicine

## 2016-04-06 DIAGNOSIS — R059 Cough, unspecified: Secondary | ICD-10-CM

## 2016-04-06 DIAGNOSIS — J069 Acute upper respiratory infection, unspecified: Secondary | ICD-10-CM | POA: Diagnosis not present

## 2016-04-06 DIAGNOSIS — R05 Cough: Secondary | ICD-10-CM | POA: Diagnosis not present

## 2016-04-06 DIAGNOSIS — B9789 Other viral agents as the cause of diseases classified elsewhere: Secondary | ICD-10-CM

## 2016-04-06 MED ORDER — DOXYCYCLINE HYCLATE 100 MG PO TABS
100.0000 mg | ORAL_TABLET | Freq: Two times a day (BID) | ORAL | 0 refills | Status: DC
Start: 2016-04-06 — End: 2016-05-14

## 2016-04-06 MED ORDER — HYDROCOD POLST-CPM POLST ER 10-8 MG/5ML PO SUER: 5.0000 mL | Freq: Two times a day (BID) | ORAL | 0 refills | Status: DC | PRN

## 2016-04-06 NOTE — Progress Notes (Signed)
Pre visit review using our clinic review tool, if applicable. No additional management support is needed unless otherwise documented below in the visit note. 

## 2016-04-06 NOTE — Assessment & Plan Note (Signed)
Likely bronchitis, nontoxic, doxy, tussionex w/ routine cautions.  F/u prn.  He agrees.  See AVS.  Okay for outpatient f/u

## 2016-04-06 NOTE — Patient Instructions (Signed)
Rest and fluids.  Start doxy.  Sedation caution on the cough syrup.   Take care.  Glad to see you.  Update me as needed.

## 2016-04-06 NOTE — Progress Notes (Signed)
Sick over the holidays, for about 1 week.  He thought he was getting better then worse in the meantime.  Cough, sputum.  No fevers.  No vomiting, no diarrhea. Appetite is okay.  No ear pain, minimal rhinorrhea.  ST.  Cough worse at night.  Taking nyquil.  Not having diffuse aches, but chest wall is sore from coughing. Sputum discolored.   Meds, vitals, and allergies reviewed.   ROS: Per HPI unless specifically indicated in ROS section   GEN: nad, alert and oriented HEENT: mucous membranes moist, tm w/o erythema, nasal exam w/o erythema, clear discharge noted,  OP with cobblestoning, sinuses not ttp  NECK: supple w/o LA CV: rrr.   PULM: scant scattered rhonchi B but o/w ctab, cough noted but no inc wob EXT: no edema

## 2016-05-14 ENCOUNTER — Encounter: Payer: Self-pay | Admitting: Primary Care

## 2016-05-14 ENCOUNTER — Ambulatory Visit (INDEPENDENT_AMBULATORY_CARE_PROVIDER_SITE_OTHER): Payer: No Typology Code available for payment source | Admitting: Primary Care

## 2016-05-14 DIAGNOSIS — B9789 Other viral agents as the cause of diseases classified elsewhere: Secondary | ICD-10-CM | POA: Diagnosis not present

## 2016-05-14 DIAGNOSIS — J069 Acute upper respiratory infection, unspecified: Secondary | ICD-10-CM

## 2016-05-14 MED ORDER — HYDROCOD POLST-CPM POLST ER 10-8 MG/5ML PO SUER: 5.0000 mL | Freq: Two times a day (BID) | ORAL | 0 refills | Status: DC | PRN

## 2016-05-14 NOTE — Progress Notes (Signed)
Subjective:    Patient ID: Juan Thompson, male    DOB: [birth year], 60 y.o.   MRN: [id number]  HPI  Juan Thompson is a 61 year old male who presents today with a chief complaint of cough. He also reports sore throat, nasal congestion, chest congestion. His symptoms began Monday (3 days ago) this week with a scratchy throat. His cough is productive with a "dark color", history of tobacco abuse. He's taken Nyquil, Mucinex, and cough drops without much improvement. He's been around his grandchildren who have had runny noses. He denies fevers, nausea, body aches.   Review of Systems  Constitutional: Negative for fever.  HENT: Positive for congestion, postnasal drip and sore throat. Negative for ear pain.   Respiratory: Positive for cough. Negative for shortness of breath and wheezing.   Cardiovascular: Negative for chest pain.       Past Medical History:  Diagnosis Date  . Arthritis    L knee, occ vicodin use  . ED (erectile dysfunction)   . History of MRI of cervical spine 04/17/05   without DDD 4/5, bulg 5/6, narrowing 5/6  . Hypercholesteremia    mild  . Hypertension   . Neck pain    with soma use for spasm  . Sleep apnea 04/17/05   mild, sleep study     Social History   Social History  . Marital status: Married    Spouse name: N/A  . Number of children: N/A  . Years of education: N/A   Occupational History  . 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 Smoking status: Former Research scientist (life sciences)  . Smokeless tobacco: Never Used     Comment: quit in ~ 2010 with Chantix  . Alcohol use Yes     Comment: occ on weekend, rare  . Drug use: No  . Sexual activity: Not on file   Other Topics Concern  . 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 therapeutic nasal fracture  1976    Family History  Problem Relation Age of Onset  . Hypertension Mother   . Heart disease Mother   . Thyroid disease Mother   . Heart disease Father     CAD stent  . Cancer Father     skin cancer  . Hypertension Brother   . Diabetes Other   . Parkinsonism Paternal Grandmother   . Colon cancer Neg Hx   . Prostate cancer Neg Hx     Allergies  Allergen Reactions  . Amlodipine Other (See Comments)    Edema/joint aches  . Codeine     REACTION: dizziness    Current Outpatient Prescriptions on File Prior to Visit  Medication Sig Dispense Refill  . carisoprodol (SOMA) 350 MG tablet TAKE ONE TABLET BY MOUTH 4 TIMES A DAY AS NEEDED 30 tablet 2  . ibuprofen (ADVIL,MOTRIN) 800 MG tablet TAKE ONE TABLET BY MOUTH EVERY 8 HOURS AS NEEDED FOR PAIN WITH FOOD 90 tablet 2  . lisinopril (PRINIVIL,ZESTRIL) 10 MG tablet Take 20mg  a day, decrease to 10mg  daily if BP is controlled. 60 tablet 12  . Multiple Vitamins-Minerals (ADULT GUMMY PO) Take by mouth. Take by mouth 4 times a week    .  traMADol (ULTRAM) 50 MG tablet TAKE 1 TABLET BY MOUTH EVERY 6 HOURS AS NEEDED 40 tablet 2   No current facility-administered medications on file prior to visit.     BP (!) 146/94   Pulse 93   Temp 98.3 F (36.8 C) (Oral)   Ht 5\' 8"  (1.727 m)   Wt 292 lb 6.4 oz (132.6 kg)   SpO2 98%   BMI 44.46 kg/m    Objective:   Physical Exam  Constitutional: He appears well-nourished.  HENT:  Right Ear: Tympanic membrane and ear canal normal.  Left Ear: Tympanic membrane and ear canal normal.  Nose: No mucosal edema. Right sinus exhibits no maxillary sinus tenderness and no frontal sinus tenderness. Left sinus exhibits no maxillary sinus tenderness and no frontal sinus tenderness.  Mouth/Throat: Oropharynx is clear and moist.  Eyes: Conjunctivae are normal.  Neck: Neck supple.  Cardiovascular: Normal rate and regular rhythm.   Pulmonary/Chest: Effort normal and breath  sounds normal. He has no wheezes. He has no rales.  Mild congestion to LUL that cleared with cough.  Skin: Skin is warm and dry.          Assessment & Plan:  URI:  Cough, congestion, sore throat x 3 days. Little improvement in cough with OTC treatment. Exam today with mostly clear lungs, does not appear sickly, vitals stable. Suspect viral URI and will treat with supportive measures. Rx for Tussionex provided per his request. Continue Mucinex, fluids, rest. Return precautions provided.  Sheral Flow, NP

## 2016-05-14 NOTE — Patient Instructions (Signed)
Your symptoms are representative of a viral illness which will resolve on its own over time. Our goal is to treat your symptoms in order to aid your body in the healing process and to make you more comfortable.   You may take the Tussionex cough suppressant twice daily as needed for cough and rest. Caution this medication contains codeine and will make you feel drowsy.  Continue Mucinex.  Please notify me if you develop persistent fevers of 101, start coughing up green mucous, notice increased fatigue or weakness, or feel worse after 1 week of onset of symptoms.   Increase consumption of water intake and rest.  It was a pleasure meeting you!   Upper Respiratory Infection, Adult Most upper respiratory infections (URIs) are a viral infection of the air passages leading to the lungs. A URI affects the nose, throat, and upper air passages. The most common type of URI is nasopharyngitis and is typically referred to as "the common cold." URIs run their course and usually go away on their own. Most of the time, a URI does not require medical attention, but sometimes a bacterial infection in the upper airways can follow a viral infection. This is called a secondary infection. Sinus and middle ear infections are common types of secondary upper respiratory infections. Bacterial pneumonia can also complicate a URI. A URI can worsen asthma and chronic obstructive pulmonary disease (COPD). Sometimes, these complications can require emergency medical care and may be life threatening. What are the causes? Almost all URIs are caused by viruses. A virus is a type of germ and can spread from one person to another. What increases the risk? You may be at risk for a URI if:  You smoke.  You have chronic heart or lung disease.  You have a weakened defense (immune) system.  You are very young or very old.  You have nasal allergies or asthma.  You work in crowded or poorly ventilated areas.  You work in  health care facilities or schools. What are the signs or symptoms? Symptoms typically develop 2-3 days after you come in contact with a cold virus. Most viral URIs last 7-10 days. However, viral URIs from the influenza virus (flu virus) can last 14-18 days and are typically more severe. Symptoms may include:  Runny or stuffy (congested) nose.  Sneezing.  Cough.  Sore throat.  Headache.  Fatigue.  Fever.  Loss of appetite.  Pain in your forehead, behind your eyes, and over your cheekbones (sinus pain).  Muscle aches. How is this diagnosed? Your health care provider may diagnose a URI by:  Physical exam.  Tests to check that your symptoms are not due to another condition such as:  Strep throat.  Sinusitis.  Pneumonia.  Asthma. How is this treated? A URI goes away on its own with time. It cannot be cured with medicines, but medicines may be prescribed or recommended to relieve symptoms. Medicines may help:  Reduce your fever.  Reduce your cough.  Relieve nasal congestion. Follow these instructions at home:  Take medicines only as directed by your health care provider.  Gargle warm saltwater or take cough drops to comfort your throat as directed by your health care provider.  Use a warm mist humidifier or inhale steam from a shower to increase air moisture. This may make it easier to breathe.  Drink enough fluid to keep your urine clear or pale yellow.  Eat soups and other clear broths and maintain good nutrition.  Rest as  needed.  Return to work when your temperature has returned to normal or as your health care provider advises. You may need to stay home longer to avoid infecting others. You can also use a face mask and careful hand washing to prevent spread of the virus.  Increase the usage of your inhaler if you have asthma.  Do not use any tobacco products, including cigarettes, chewing tobacco, or electronic cigarettes. If you need help quitting, ask  your health care provider. How is this prevented? The best way to protect yourself from getting a cold is to practice good hygiene.  Avoid oral or hand contact with people with cold symptoms.  Wash your hands often if contact occurs. There is no clear evidence that vitamin C, vitamin E, echinacea, or exercise reduces the chance of developing a cold. However, it is always recommended to get plenty of rest, exercise, and practice good nutrition. Contact a health care provider if:  You are getting worse rather than better.  Your symptoms are not controlled by medicine.  You have chills.  You have worsening shortness of breath.  You have brown or red mucus.  You have yellow or brown nasal discharge.  You have pain in your face, especially when you bend forward.  You have a fever.  You have swollen neck glands.  You have pain while swallowing.  You have white areas in the back of your throat. Get help right away if:  You have severe or persistent:  Headache.  Ear pain.  Sinus pain.  Chest pain.  You have chronic lung disease and any of the following:  Wheezing.  Prolonged cough.  Coughing up blood.  A change in your usual mucus.  You have a stiff neck.  You have changes in your:  Vision.  Hearing.  Thinking.  Mood. This information is not intended to replace advice given to you by your health care provider. Make sure you discuss any questions you have with your health care provider. Document Released: 10/21/2000 Document Revised: 12/29/2015 Document Reviewed: 08/02/2013 Elsevier Interactive Patient Education  2017 [employer].

## 2016-05-14 NOTE — Progress Notes (Signed)
Pre visit review using our clinic review tool, if applicable. No additional management support is needed unless otherwise documented below in the visit note. 

## 2016-05-18 ENCOUNTER — Other Ambulatory Visit: Payer: Self-pay

## 2016-05-18 DIAGNOSIS — J069 Acute upper respiratory infection, unspecified: Secondary | ICD-10-CM

## 2016-05-18 DIAGNOSIS — B9789 Other viral agents as the cause of diseases classified elsewhere: Principal | ICD-10-CM

## 2016-05-18 NOTE — Telephone Encounter (Signed)
Pt left v/m requesting rx for tussionex. Pt last seen and rx last printed # 70 ml on 05/14/16. Pt request cb when ready for pick up.  Pt requesting early?

## 2016-05-19 NOTE — Telephone Encounter (Signed)
Before I call the pt, what reason do I give him for the denial?

## 2016-05-19 NOTE — Telephone Encounter (Signed)
I notified one of the front office ladies yesterday that it was too soon to fill. He was upfront and received the message.

## 2016-05-20 MED ORDER — HYDROCOD POLST-CPM POLST ER 10-8 MG/5ML PO SUER: 5.0000 mL | Freq: Two times a day (BID) | ORAL | 0 refills | Status: DC | PRN

## 2016-05-20 NOTE — Telephone Encounter (Signed)
Pt left v/m requesting rx for tussionex. Pt seen 05/14/16 so I think 05/21/16 will be one week. Pt still coughing at night and cannot sleep well. Pt request cb when ready for pick up.

## 2016-05-20 NOTE — Addendum Note (Signed)
Addended by: Helene Shoe on: 05/20/2016 08:30 AM   Modules accepted: Orders

## 2016-05-21 NOTE — Telephone Encounter (Signed)
Spoken to patient this morning.Notified patient of Kate's comments. Patient stated that he feels a little bit better. The cough is productive but still clear most of the time but here and there, it has a tint. He has not had a fever. He just feels drained from the coughing.   Notified patient that the Rx for Tussionex is left the front office for patient to pick up. Patient verbalized understanding.

## 2016-05-27 ENCOUNTER — Other Ambulatory Visit: Payer: Self-pay | Admitting: Family Medicine

## 2016-05-29 NOTE — Telephone Encounter (Signed)
Last filled 03/12/16 with 2 refills... Please advise

## 2016-05-30 NOTE — Telephone Encounter (Signed)
Rx called in to pharmacy. 

## 2016-05-30 NOTE — Telephone Encounter (Signed)
Please call in.  Thanks.   

## 2016-07-08 ENCOUNTER — Other Ambulatory Visit: Payer: Self-pay | Admitting: Family Medicine

## 2016-07-08 NOTE — Telephone Encounter (Signed)
Rx called to pharmacy as instructed. 

## 2016-07-08 NOTE — Telephone Encounter (Signed)
Please call in.  Thanks.   

## 2016-07-08 NOTE — Telephone Encounter (Signed)
Received refill electronically Last refill 12/05/15 #30/2 Last office visit 05/14/16/acute

## 2016-08-27 ENCOUNTER — Other Ambulatory Visit: Payer: Self-pay | Admitting: Family Medicine

## 2016-08-27 NOTE — Telephone Encounter (Signed)
Electronic refill request. Last office visit:   05/14/16 Juan Thompson Last Filled:    40 tablet 2 05/30/2016  Please advise.

## 2016-08-28 NOTE — Telephone Encounter (Signed)
Medication phoned to pharmacy.  

## 2016-08-28 NOTE — Telephone Encounter (Signed)
Please call in.  Thanks.   

## 2016-11-02 ENCOUNTER — Other Ambulatory Visit: Payer: Self-pay | Admitting: Family Medicine

## 2016-11-09 ENCOUNTER — Other Ambulatory Visit: Payer: Self-pay | Admitting: Family Medicine

## 2016-11-09 NOTE — Telephone Encounter (Signed)
Electronic refill request. Tramadol Last office visit:   CPE 10/17/15. Acute: 05/14/16 Last Filled:    40 tablet 2 08/28/2016  Please advise.

## 2016-11-09 NOTE — Telephone Encounter (Signed)
Please call in.  Thanks.   

## 2016-11-10 NOTE — Telephone Encounter (Signed)
Medication phoned to pharmacy.  

## 2016-11-16 ENCOUNTER — Other Ambulatory Visit: Payer: Self-pay | Admitting: *Deleted

## 2016-12-05 ENCOUNTER — Other Ambulatory Visit: Payer: Self-pay | Admitting: Family Medicine

## 2016-12-19 ENCOUNTER — Other Ambulatory Visit: Payer: Self-pay | Admitting: Family Medicine

## 2016-12-21 NOTE — Telephone Encounter (Signed)
Electronic refill request.  Carisoprodol Last office visit:   10/17/15 Last Filled:    30 tablet 2 07/08/2016  Please advise.

## 2016-12-22 NOTE — Telephone Encounter (Signed)
Medication phoned to pharmacy. Left detailed message on voicemail of patient to schedule CPE. 

## 2016-12-22 NOTE — Telephone Encounter (Signed)
Please call in.  Thanks. Last OV 03/2016.  Due for CPE when possible.

## 2017-01-11 ENCOUNTER — Other Ambulatory Visit: Payer: Self-pay | Admitting: Family Medicine

## 2017-01-12 NOTE — Telephone Encounter (Signed)
Last office visit 05/14/2016 with [facility] for URI.  Last CPE 10/17/15.  Last refill stated needs CPE prior to any additional refills.  No future appointments.  Refill?

## 2017-01-13 NOTE — Telephone Encounter (Signed)
Sent. Schedule physical when possible. If his insurance situation is making this more difficult then please get him scheduled for a regular office visit just so we can check up on basic items..Thanks.

## 2017-01-13 NOTE — Telephone Encounter (Signed)
Patient notified as instructed by telephone and verbalized understanding. Patient stated that it looks like the only thing that his insurance will cover is a physical. Patient stated that he will try to call back by tomorrow to get a physical scheduled.

## 2017-01-13 NOTE — Addendum Note (Signed)
Addended by: Ellamae Sia on: 01/13/2017 05:36 PM   Modules accepted: Orders

## 2017-01-15 ENCOUNTER — Other Ambulatory Visit (INDEPENDENT_AMBULATORY_CARE_PROVIDER_SITE_OTHER): Payer: No Typology Code available for payment source

## 2017-01-15 DIAGNOSIS — Z125 Encounter for screening for malignant neoplasm of prostate: Secondary | ICD-10-CM

## 2017-01-15 DIAGNOSIS — Z119 Encounter for screening for infectious and parasitic diseases, unspecified: Secondary | ICD-10-CM

## 2017-01-15 LAB — LAB REPORT - SCANNED
ALT: 19
AST: 22
BUN: 25 — AB (ref 4–21)
Cholesterol, Total: 200
Creatinine, Ser: 1.04
Glucose: 99
HDL Cholesterol: 33
LDL Cholesterol (Calc): 139
Potassium: 4.8
Triglycerides: 138

## 2017-01-16 LAB — HIV ANTIBODY (ROUTINE TESTING W REFLEX): HIV Screen 4th Generation wRfx: NONREACTIVE

## 2017-01-16 LAB — PSA: Prostate Specific Ag, Serum: 0.7 ng/mL (ref 0.0–4.0)

## 2017-01-20 ENCOUNTER — Encounter: Payer: Self-pay | Admitting: Family Medicine

## 2017-01-20 ENCOUNTER — Ambulatory Visit (INDEPENDENT_AMBULATORY_CARE_PROVIDER_SITE_OTHER): Payer: No Typology Code available for payment source | Admitting: Family Medicine

## 2017-01-20 VITALS — BP 136/76 | HR 83 | Temp 97.9°F | Ht 68.25 in | Wt 287.0 lb

## 2017-01-20 DIAGNOSIS — Z0000 Encounter for general adult medical examination without abnormal findings: Secondary | ICD-10-CM

## 2017-01-20 DIAGNOSIS — Z1211 Encounter for screening for malignant neoplasm of colon: Secondary | ICD-10-CM

## 2017-01-20 DIAGNOSIS — Z23 Encounter for immunization: Secondary | ICD-10-CM

## 2017-01-20 DIAGNOSIS — Z7189 Other specified counseling: Secondary | ICD-10-CM

## 2017-01-20 DIAGNOSIS — I10 Essential (primary) hypertension: Secondary | ICD-10-CM

## 2017-01-20 MED ORDER — LISINOPRIL 10 MG PO TABS: ORAL_TABLET | ORAL | 3 refills | Status: DC

## 2017-01-20 MED ORDER — IBUPROFEN 800 MG PO TABS: ORAL_TABLET | ORAL | 3 refills | Status: DC

## 2017-01-20 NOTE — Patient Instructions (Addendum)
Check with your insurance to see if they will cover the shingrix shot when you turn 61.   Go to the lab on the way out.  We'll contact you with your lab report (stool cards). Take care.  Glad to see you.  Update me as needed.

## 2017-01-20 NOTE — Progress Notes (Signed)
CPE- See plan.  Routine anticipatory guidance given to patient.  See health maintenance.  The possibility exists that previously documented standard health maintenance information may have been brought forward from a previous encounter into this note.  If needed, that same information has been updated to reflect the current situation based on today's encounter.    Tetanus 2017  PNA 2012  Flu yearly  Shingles shot due at 61. D/w pt.  PSA wnl D/w patient TK:PTWSFKC for colon cancer screening, including IFOB vs. colonoscopy. Risks and benefits of both were discussed and patient voiced understanding. Pt elects LEX:NTZG.  Advance directive d/w pt. Would have his wife designated if incapacitated. Encouraged living will.  HCV prev neg. dw pt.  HIV neg.  D/w pt.    Using soma and tramadol prn for neck pain.  No ADE on med.  Compliant.  Prn use usually.  Not daily use, usually.  He had R lower back and thigh pain "after tweaking my back working on a car".  It is getting some better in the meantime.  Using ibuprofen as needed, with routine cautions d/w pt.    OSA.  Couldn't tolerate CPAP but got some relief with elevation of head of bed.  D/w pt about gradual weight loss.    Hypertension:    Using medication without problems or lightheadedness: yes Chest pain with exertion:no Edema:no Short of breath:no Taking 20mg  of lisinopril a day.    His R4th finger was triggering at the R4th MCP.  He was prev injected by ortho.  D/w pt about ortho f/u.  He'll update me as needed.   PMH and SH reviewed  Meds, vitals, and allergies reviewed.   ROS: Per HPI.  Unless specifically indicated otherwise in HPI, the patient denies:  General: fever. Eyes: acute vision changes ENT: sore throat Cardiovascular: chest pain Respiratory: SOB GI: vomiting GU: dysuria Musculoskeletal: acute back pain Derm: acute rash Neuro: acute motor dysfunction Psych: worsening mood Endocrine: polydipsia Heme:  bleeding Allergy: hayfever  GEN: nad, alert and oriented HEENT: mucous membranes moist NECK: supple w/o LA CV: rrr. PULM: ctab, no inc wob ABD: soft, +bs EXT: no edema SKIN: no acute rash Normal range of motion right hand and fingers.

## 2017-01-21 DIAGNOSIS — Z7189 Other specified counseling: Secondary | ICD-10-CM | POA: Insufficient documentation

## 2017-01-21 NOTE — Assessment & Plan Note (Signed)
Advance directive d/w pt. Would have his wife designated if incapacitated. Encouraged living will.

## 2017-01-21 NOTE — Assessment & Plan Note (Signed)
Head use tramadol and Soma as needed for muscle spasms in his neck and also in his back. Use ibuprofen as needed. Routine cautions given. He does get relief with these medications. No adverse effect. Continue when necessary use.

## 2017-01-21 NOTE — Assessment & Plan Note (Signed)
Controlled. Continue current medications. Discussed with patient about diet and exercise for weight loss.

## 2017-01-21 NOTE — Assessment & Plan Note (Signed)
Tetanus 2017  PNA 2012  Flu yearly  Shingles shot due at 61. D/w pt.  PSA wnl D/w patient CL:EXNTZGY for colon cancer screening, including IFOB vs. colonoscopy. Risks and benefits of both were discussed and patient voiced understanding. Pt elects FVC:BSWH.  Advance directive d/w pt. Would have his wife designated if incapacitated. Encouraged living will.  HCV prev neg. dw pt.  HIV neg.  D/w pt.

## 2017-01-23 LAB — LIPID PANEL W/O CHOL/HDL RATIO
Cholesterol, Total: 200 mg/dL — ABNORMAL HIGH (ref 100–199)
HDL: 33 mg/dL — ABNORMAL LOW (ref >39)
LDL Calculated: 139 mg/dL — ABNORMAL HIGH (ref 0–99)
Triglycerides: 138 mg/dL (ref 0–149)
VLDL Cholesterol Cal: 28 mg/dL (ref 5–40)

## 2017-01-23 LAB — CMP12+1AC
ALT: 19 IU/L (ref 0–44)
AST: 22 IU/L (ref 0–40)
Albumin/Globulin Ratio: 1.7 (ref 1.2–2.2)
Albumin: 4.2 g/dL (ref 3.5–5.5)
Alkaline Phosphatase: 73 IU/L (ref 39–117)
BUN/Creatinine Ratio: 24 — ABNORMAL HIGH (ref 9–20)
BUN: 25 mg/dL — ABNORMAL HIGH (ref 6–24)
Bilirubin Total: <0.2 mg/dL (ref 0.0–1.2)
Calcium: 9.1 mg/dL (ref 8.7–10.2)
Chloride: 103 mmol/L (ref 96–106)
Creatinine, Ser: 1.04 mg/dL (ref 0.76–1.27)
GFR calc Af Amer: 90 mL/min/{1.73_m2} (ref >59)
GFR calc non Af Amer: 78 mL/min/{1.73_m2} (ref >59)
Globulin, Total: 2.5 g/dL (ref 1.5–4.5)
Glucose: 99 mg/dL (ref 65–99)
Potassium: 4.8 mmol/L (ref 3.5–5.2)
Sodium: 143 mmol/L (ref 134–144)
Total Protein: 6.7 g/dL (ref 6.0–8.5)

## 2017-01-23 LAB — SPECIMEN STATUS REPORT

## 2017-01-25 ENCOUNTER — Encounter: Payer: Self-pay | Admitting: Family Medicine

## 2017-02-03 LAB — FECAL OCCULT BLOOD, IMMUNOCHEMICAL: Fecal Occult Bld: POSITIVE — AB

## 2017-02-04 ENCOUNTER — Other Ambulatory Visit: Payer: Self-pay | Admitting: Family Medicine

## 2017-02-04 DIAGNOSIS — R195 Other fecal abnormalities: Secondary | ICD-10-CM

## 2017-02-05 ENCOUNTER — Encounter: Payer: Self-pay | Admitting: Family Medicine

## 2017-02-05 ENCOUNTER — Encounter: Payer: Self-pay | Admitting: *Deleted

## 2017-02-05 LAB — LAB REPORT - SCANNED
ALT: 19
AST: 22
Cholesterol, Total: 200
Creatinine, Ser: 1.04
Glucose: 99
HDL Cholesterol: 33
LDL Cholesterol (Calc): 139
Triglycerides: 138

## 2017-02-21 ENCOUNTER — Other Ambulatory Visit: Payer: Self-pay | Admitting: Family Medicine

## 2017-02-22 NOTE — Telephone Encounter (Signed)
Electronic refill request. Tramadol Last office visit:   01/20/17 Last Filled:    40 tablet 2 11/09/2016  Please advise.

## 2017-02-23 NOTE — Telephone Encounter (Signed)
Medication phoned to pharmacy.  

## 2017-02-23 NOTE — Telephone Encounter (Signed)
Please call in.  Thanks.   

## 2017-02-28 ENCOUNTER — Other Ambulatory Visit: Payer: Self-pay | Admitting: Family Medicine

## 2017-03-01 NOTE — Telephone Encounter (Signed)
Received refill electronically Last refill 12/22/16 #30/2 Last office visit 01/20/17

## 2017-03-02 NOTE — Telephone Encounter (Signed)
Rx called to pharmacy as instructed. 

## 2017-03-02 NOTE — Telephone Encounter (Signed)
Please call in.  Thanks.   

## 2017-04-28 ENCOUNTER — Encounter: Payer: Self-pay | Admitting: Gastroenterology

## 2017-05-06 ENCOUNTER — Other Ambulatory Visit: Payer: Self-pay | Admitting: Family Medicine

## 2017-05-06 NOTE — Telephone Encounter (Signed)
Electronic refill request. Tramadol Last office visit:   01/20/17 Last Filled:    40 tablet 2 02/23/2017  Please advise.

## 2017-05-07 MED ORDER — TRAMADOL HCL 50 MG PO TABS: 50.0000 mg | ORAL_TABLET | Freq: Four times a day (QID) | ORAL | 2 refills | Status: DC | PRN

## 2017-05-07 NOTE — Telephone Encounter (Signed)
Please call in.  Thanks.   

## 2017-05-07 NOTE — Telephone Encounter (Signed)
Medication phoned to pharmacy.  

## 2017-05-14 ENCOUNTER — Other Ambulatory Visit: Payer: Self-pay | Admitting: *Deleted

## 2017-05-14 MED ORDER — LISINOPRIL 10 MG PO TABS: ORAL_TABLET | ORAL | 2 refills | Status: DC

## 2017-05-19 ENCOUNTER — Encounter: Payer: Self-pay | Admitting: Family Medicine

## 2017-05-24 ENCOUNTER — Encounter: Payer: Self-pay | Admitting: *Deleted

## 2017-06-04 ENCOUNTER — Ambulatory Visit: Payer: BLUE CROSS/BLUE SHIELD | Admitting: Family Medicine

## 2017-06-04 ENCOUNTER — Encounter: Payer: Self-pay | Admitting: Family Medicine

## 2017-06-04 DIAGNOSIS — J069 Acute upper respiratory infection, unspecified: Secondary | ICD-10-CM

## 2017-06-04 MED ORDER — AMOXICILLIN-POT CLAVULANATE 875-125 MG PO TABS: 1.0000 | ORAL_TABLET | Freq: Two times a day (BID) | ORAL | 0 refills | Status: DC

## 2017-06-04 MED ORDER — BENZONATATE 200 MG PO CAPS: 200.0000 mg | ORAL_CAPSULE | Freq: Three times a day (TID) | ORAL | 1 refills | Status: DC | PRN

## 2017-06-04 MED ORDER — CARISOPRODOL 350 MG PO TABS: ORAL_TABLET | ORAL | 2 refills | Status: DC

## 2017-06-04 NOTE — Patient Instructions (Signed)
Ask for a visit for me to remove the spot on your neck.   Rest and fluids.  Use nasacort.   If worse in the meantime, then start the antibiotics.   Take tessalon for the cough.

## 2017-06-04 NOTE — Progress Notes (Signed)
duration of symptoms: for a few weeks.   rhinorrhea:no congestion:yes ear pain:no sore throat:yes, irritation locally Cough: yes Myalgias: no  Fevers: no No facial pain.   Using nasacort in the meantime.    He had more shoulder pain recently and had used his pain meds for that with some relief.  He is running a lathe and pushing a lot with that, and that likely aggravates his R shoulder pain.  He was also doing some flooring.    He had been using tramadol prn for knee pain w/o ADE.    Per HPI unless specifically indicated in ROS section   Meds, vitals, and allergies reviewed.   GEN: nad, alert and oriented HEENT: mucous membranes moist, TM w/o erythema, nasal epithelium injected, OP with cobblestoning NECK: supple w/o LA CV: rrr. PULM: ctab, no inc wob ABD: soft, +bs  Papule at old abscess site on the R neck that gets irritated intermittently.  No discharge or spreading erythema now.

## 2017-06-06 NOTE — Assessment & Plan Note (Signed)
Nontoxic.  Supportive care.  Okay for outpatient follow-up. Rest and fluids.  Use nasacort.   If worse in the meantime, then start Augmentin and update me as needed. Take tessalon for the cough.  He agrees.  He can return when well for me to shave off the lesion noted on his neck.

## 2017-06-09 NOTE — Progress Notes (Signed)
[facility] Consult Note:  History: Juan Thompson 06/10/2017  Referring physician: Tonia Ghent, MD  Reason for consult/chief complaint: Heme Positive Stools (Pt has not seen any blood. He has no symtoms)   Subjective  HPI:  This is a 62 year old man referred by Dr. Damita Dunnings for heme positive stool.  Juan Thompson shows this testing for colon cancer screening after discussion with Dr. Damita Dunnings in September. Positive screening iFOBT 01/2017  He reports what sounds like a sigmoidoscopy at least 15 years ago, no report available.  It does not sound like he is ever had a full colonoscopy.  He denies abdominal pain, change in bowel habits or visible GI bleeding in the way of melena or bright red blood.  He denies any chronic upper digestive symptoms such as heartburn dysphagia nausea vomiting early satiety or weight loss.  ROS:  Review of Systems  Constitutional: Negative for appetite change and unexpected weight change.  HENT: Negative for mouth sores and voice change.   Eyes: Negative for pain and redness.  Respiratory: Negative for cough and shortness of breath.   Cardiovascular: Negative for chest pain and palpitations.  Genitourinary: Negative for dysuria and hematuria.  Musculoskeletal: Negative for arthralgias and myalgias.  Skin: Negative for pallor and rash.  Neurological: Negative for weakness and headaches.  Hematological: Negative for adenopathy.     Past Medical History: Past Medical History:  Diagnosis Date  . Arthritis    L knee, occ vicodin use  . ED (erectile dysfunction)   . History of MRI of cervical spine 04/17/05   without DDD 4/5, bulg 5/6, narrowing 5/6  . Hypercholesteremia    mild  . Hypertension   . MRSA bacteremia    lower extremity- age 49  . Neck pain    with soma use for spasm  . Sleep apnea 04/17/05   mild, couldn't tolerate CPAP.      Past Surgical History: Past Surgical History:  Procedure Laterality Date  .  therapeutic nasal fracture  1976  . WISDOM TOOTH EXTRACTION       Family History: Family History  Problem Relation Age of Onset  . Hypertension Mother   . Heart disease Mother   . Thyroid disease Mother   . Heart disease Father        CAD stent  . Cancer Father        skin cancer  . Hypertension Brother   . Diabetes Other   . Parkinsonism Paternal Grandmother   . Colon cancer Neg Hx   . Prostate cancer Neg Hx     Social History: Social History   Socioeconomic History  . Marital status: Married    Spouse name: None  . Number of children: 0  . Years of education: None  . Highest education level: None  Social Needs  . Financial resource strain: None  . Food insecurity - worry: None  . Food insecurity - inability: None  . Transportation needs - medical: None  . Transportation needs - non-medical: None  Occupational History  . Occupation: Furniture conservator/restorer for [employer]  Tobacco Use  . Smoking status: Former Research scientist (life sciences)  . Smokeless tobacco: Never Used  . Tobacco comment: quit in ~ 2010 with Chantix  Substance and Sexual Activity  . Alcohol use: Yes    Comment: occ on weekend, rare  . Drug use: No  . Sexual activity: None  Other Topics Concern  . 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 Amlodipine Other (See Comments)    Edema/joint aches  . Codeine     REACTION: dizziness    Outpatient Meds: Current Outpatient Medications  Medication Sig Dispense Refill  . amoxicillin-clavulanate (AUGMENTIN) 875-125 MG tablet Take 1 tablet by mouth 2 (two) times daily. 20 tablet 0  . benzonatate (TESSALON) 200 MG capsule Take 1 capsule (200 mg total) by mouth 3 (three) times daily as needed. 30 capsule 1  . carisoprodol (SOMA) 350 MG tablet  TAKE 1 TABLET BY MOUTH 4 TIMES A DAY AS NEEDED 30 tablet 2  . ibuprofen (ADVIL,MOTRIN) 800 MG tablet TAKE ONE TABLET BY MOUTH EVERY 8 HOURS AS NEEDED FOR PAIN WITH FOOD 90 tablet 3  . lisinopril (PRINIVIL,ZESTRIL) 10 MG tablet TAKE 2 TABLETS A DAY BY MOUTH, DECREASE TO 1 TABLET DAILY IF BLOOD PRESSURE IS CONTROLLED. 180 tablet 2  . Multiple Vitamins-Minerals (ADULT GUMMY PO) Take by mouth. Take by mouth 4 times a week    . traMADol (ULTRAM) 50 MG tablet Take 1 tablet (50 mg total) by mouth every 6 (six) hours as needed. 40 tablet 2  . triamcinolone (NASACORT ALLERGY 24HR) 55 MCG/ACT AERO nasal inhaler Place 2 sprays into the nose daily.     No current facility-administered medications for this visit.       ___________________________________________________________________ Objective   Exam:  BP 124/76   Pulse 72   Ht 5\' 8"  (1.727 m)   Wt 287 lb 2 oz (130.2 kg)   BMI 43.66 kg/m    General: this is a(n) morbidly obese and well-appearing man  Eyes: sclera anicteric, no redness  ENT: oral mucosa moist without lesions, no cervical or supraclavicular lymphadenopathy, good dentition  CV: RRR without murmur, S1/S2, no JVD, no peripheral edema  Resp: clear to auscultation bilaterally, normal RR and effort noted  GI: soft, no tenderness, with active bowel sounds. No guarding or palpable organomegaly noted.  Skin; warm and dry, no rash or jaundice noted  Neuro: awake, alert and oriented x 3. Normal gross motor function and fluent speech  Labs:  CBC Latest Ref Rng & Units 07/01/2010 06/27/2009 12/07/2007  WBC 4.5 - 10.5 K/uL 7.1 5.9 6.5  Hemoglobin 13.0 - 17.0 g/dL 14.2 14.0 14.6  Hematocrit 39.0 - 52.0 % 41.9 42.6 41.3  Platelets 150.0 - 400.0 K/uL 232.0 198.0 244   No recent labs  Assessment: Encounter Diagnosis  Name Primary?  . Heme positive stool Yes    Asymptomatic.  Advised him to have a colonoscopy to rule out advanced polyps or cancer.  He is agreeable, but would  like to check with his insurance company first regarding the cost.  He was given some available procedure dates and encouraged to contact us as soon as he feels ready to schedule it.  Thank you for the courtesy of this consult.  Please call me with any questions or concerns.  Nelida Meuse III  CC: Tonia Ghent, MD

## 2017-06-10 ENCOUNTER — Encounter: Payer: Self-pay | Admitting: Gastroenterology

## 2017-06-10 ENCOUNTER — Ambulatory Visit: Payer: BLUE CROSS/BLUE SHIELD | Admitting: Gastroenterology

## 2017-06-10 VITALS — BP 124/76 | HR 72 | Ht 68.0 in | Wt 287.1 lb

## 2017-06-10 DIAGNOSIS — R195 Other fecal abnormalities: Secondary | ICD-10-CM | POA: Diagnosis not present

## 2017-06-10 NOTE — Patient Instructions (Signed)
Please call us when you are ready to schedule a colonoscopy

## 2017-07-12 ENCOUNTER — Encounter: Payer: Self-pay | Admitting: Family Medicine

## 2017-07-12 ENCOUNTER — Ambulatory Visit: Payer: BLUE CROSS/BLUE SHIELD | Admitting: Family Medicine

## 2017-07-12 VITALS — BP 134/80 | HR 72 | Temp 98.6°F | Wt 283.8 lb

## 2017-07-12 DIAGNOSIS — J069 Acute upper respiratory infection, unspecified: Secondary | ICD-10-CM

## 2017-07-12 DIAGNOSIS — B9789 Other viral agents as the cause of diseases classified elsewhere: Secondary | ICD-10-CM | POA: Diagnosis not present

## 2017-07-12 MED ORDER — HYDROCOD POLST-CPM POLST ER 10-8 MG/5ML PO SUER: 5.0000 mL | Freq: Two times a day (BID) | ORAL | 0 refills | Status: DC | PRN

## 2017-07-12 NOTE — Patient Instructions (Signed)
I hope you are feeling better soon  Continue Mucinex and good fluid intake, get some extra rest  For nasal congestion you can use Afrin nasal spray for 3 days max, saline nasal spray (generic is fine for all). Drink enough fluids to make your urine light yellow. For fever/chill/muscle aches you can take over the counter acetaminophen or ibuprofen.  Please come back in if you are not better in 5-7 days or if you develop wheezing, shortness of breath or persistent vomiting.

## 2017-07-12 NOTE — Progress Notes (Signed)
Subjective:    Patient ID: Juan Thompson, male    DOB: [birth year], 62 y.o.   MRN: [id number]  HPI This is a 62 yo male who presents today with 4 days of sore throat. One day later he developed nasal congestion and productive cough. No fever. No myalgias. No ear pain or headache. No sick contacts. Has been taking Mucinex without much relief. States only thing that works for his cough is Tussionex.   Past Medical History:  Diagnosis Date  . Arthritis    L knee, occ vicodin use  . ED (erectile dysfunction)   . History of MRI of cervical spine 04/17/05   without DDD 4/5, bulg 5/6, narrowing 5/6  . Hypercholesteremia    mild  . Hypertension   . MRSA bacteremia    lower extremity- age 64  . Neck pain    with soma use for spasm  . Sleep apnea 04/17/05   mild, couldn't tolerate CPAP.    Past Surgical History:  Procedure Laterality Date  . therapeutic nasal fracture  1976  . WISDOM TOOTH EXTRACTION     Family History  Problem Relation Age of Onset  . Hypertension Mother   . Heart disease Mother   . Thyroid disease Mother   . Heart disease Father        CAD stent  . Cancer Father        skin cancer  . Hypertension Brother   . Diabetes Other   . Parkinsonism Paternal Grandmother   . Colon cancer Neg Hx   . Prostate cancer Neg Hx    Social History   Tobacco Use  . Smoking status: Former Research scientist (life sciences)  . Smokeless tobacco: Never Used  . Tobacco comment: quit in ~ 2010 with Chantix  Substance Use Topics  . Alcohol use: Yes    Comment: occ on weekend, rare  . Drug use: No      Review of Systems Per HPI    Objective:   Physical Exam  Constitutional: He is oriented to person, place, and time. He appears well-developed and well-nourished. No distress.  HENT:  Head: Normocephalic and atraumatic.  Right Ear: Tympanic membrane, external ear and ear canal normal.  Left Ear: Tympanic membrane, external ear and ear canal normal.  Nose: Mucosal edema and rhinorrhea present.    Mouth/Throat: Uvula is midline, oropharynx is clear and moist and mucous membranes are normal.  Eyes: Conjunctivae are normal.  Neck: Neck supple.  Cardiovascular: Normal rate, regular rhythm and normal heart sounds.  Pulmonary/Chest: Effort normal and breath sounds normal.  Lymphadenopathy:    He has no cervical adenopathy.  Neurological: He is alert and oriented to person, place, and time.  Skin: Skin is warm and dry. He is not diaphoretic.  Psychiatric: He has a normal mood and affect. His behavior is normal. Judgment and thought content normal.  Vitals reviewed.        BP 134/80   Pulse 72   Temp 98.6 F (37 C) (Oral)   Wt 283 lb 12 oz (128.7 kg)   SpO2 95%   BMI 43.14 kg/m  Wt Readings from Last 3 Encounters:  07/12/17 283 lb 12 oz (128.7 kg)  06/10/17 287 lb 2 oz (130.2 kg)  06/04/17 287 lb 4 oz (130.3 kg)    Assessment & Plan:  1. Viral URI with cough - Provided written and verbal information regarding diagnosis and treatment. -  Patient Instructions  I hope you are feeling better soon  Continue Mucinex and good fluid intake, get some extra rest  For nasal congestion you can use Afrin nasal spray for 3 days max, saline nasal spray (generic is fine for all). Drink enough fluids to make your urine light yellow. For fever/chill/muscle aches you can take over the counter acetaminophen or ibuprofen.  Please come back in if you are not better in 5-7 days or if you develop wheezing, shortness of breath or persistent vomiting.    - chlorpheniramine-HYDROcodone (TUSSIONEX PENNKINETIC ER) 10-8 MG/5ML SUER; Take 5 mLs by mouth every 12 (twelve) hours as needed for cough.  Dispense: 115 mL; Refill: 0   Clarene Reamer, FNP-BC  [facility]  07/12/2017 12:17 PM

## 2017-07-19 ENCOUNTER — Telehealth: Payer: Self-pay | Admitting: Family Medicine

## 2017-07-19 DIAGNOSIS — J069 Acute upper respiratory infection, unspecified: Secondary | ICD-10-CM

## 2017-07-19 DIAGNOSIS — B9789 Other viral agents as the cause of diseases classified elsewhere: Principal | ICD-10-CM

## 2017-07-19 MED ORDER — HYDROCOD POLST-CPM POLST ER 10-8 MG/5ML PO SUER: 5.0000 mL | Freq: Two times a day (BID) | ORAL | 0 refills | Status: DC | PRN

## 2017-07-19 NOTE — Telephone Encounter (Signed)
Left detailed message on voicemail.  

## 2017-07-19 NOTE — Telephone Encounter (Signed)
Sent.  Sedation caution on med.  Needs OV if not better or if getting worse.  Thanks.

## 2017-07-19 NOTE — Telephone Encounter (Signed)
Copied from [place]. Topic: Inquiry >> Jul 19, 2017  8:14 AM Margot Ables wrote: Reason for CRM: pt still having a bad cough since his OV 07/12/17 with Clarene Reamer, FNP. Pt is asking if he can get a refill on chlorpheniramine-HYDROcodone (TUSSIONEX PENNKINETIC ER) 10-8 MG/5ML SURE or if he needs to come back. Pt states this is a controlled med and can only be sent in 5 day supply. Pt will come in for another appt if needed. Please advise  [id number] [address], [place] (Phone) [phone] (Fax)

## 2017-07-25 ENCOUNTER — Other Ambulatory Visit: Payer: Self-pay | Admitting: Family Medicine

## 2017-07-26 NOTE — Telephone Encounter (Signed)
Electronic refill request Last refill 05/07/17 #40/2 Last office visit 07/12/17/acute

## 2017-07-27 NOTE — Telephone Encounter (Signed)
Sent. Thanks.   

## 2017-08-26 ENCOUNTER — Ambulatory Visit (AMBULATORY_SURGERY_CENTER): Payer: Self-pay

## 2017-08-26 VITALS — Ht 69.0 in | Wt 287.4 lb

## 2017-08-26 DIAGNOSIS — Z1211 Encounter for screening for malignant neoplasm of colon: Secondary | ICD-10-CM

## 2017-08-26 MED ORDER — NA SULFATE-K SULFATE-MG SULF 17.5-3.13-1.6 GM/177ML PO SOLN: 1.0000 | Freq: Once | ORAL | 0 refills | Status: AC

## 2017-08-26 NOTE — Progress Notes (Signed)
Per pt, no allergies to soy or egg products.Pt not taking any weight loss meds or using  O2 at home.  Pt refused emmi video. 

## 2017-08-31 ENCOUNTER — Encounter: Payer: Self-pay | Admitting: Gastroenterology

## 2017-09-10 ENCOUNTER — Encounter: Payer: Self-pay | Admitting: Gastroenterology

## 2017-09-10 ENCOUNTER — Other Ambulatory Visit: Payer: Self-pay

## 2017-09-10 ENCOUNTER — Ambulatory Visit (AMBULATORY_SURGERY_CENTER): Payer: BLUE CROSS/BLUE SHIELD | Admitting: Gastroenterology

## 2017-09-10 VITALS — BP 112/78 | HR 81 | Temp 98.6°F | Resp 15 | Ht 69.0 in | Wt 287.0 lb

## 2017-09-10 DIAGNOSIS — R195 Other fecal abnormalities: Secondary | ICD-10-CM

## 2017-09-10 DIAGNOSIS — D122 Benign neoplasm of ascending colon: Secondary | ICD-10-CM | POA: Diagnosis not present

## 2017-09-10 DIAGNOSIS — D128 Benign neoplasm of rectum: Secondary | ICD-10-CM

## 2017-09-10 MED ORDER — SODIUM CHLORIDE 0.9 % IV SOLN: 500.0000 mL | Freq: Once | INTRAVENOUS | Status: DC

## 2017-09-10 NOTE — Progress Notes (Signed)
Pt's states no medical or surgical changes since previsit or office visit. 

## 2017-09-10 NOTE — Progress Notes (Signed)
Called to room to assist during endoscopic procedure.  Patient ID and intended procedure confirmed with present staff. Received instructions for my participation in the procedure from the performing physician.  

## 2017-09-10 NOTE — Op Note (Signed)
[facility] Patient Name: Juan Thompson Procedure Date: 09/10/2017 9:08 AM MRN: [id number] Endoscopist: Mallie Mussel L. Loletha Carrow , MD Age: 62 Referring MD:  Date of Birth: June 27, 1955 Gender: Male Account #: [id number] Procedure:                Colonoscopy Indications:              Heme positive stool Medicines:                Monitored Anesthesia Care Procedure:                Pre-Anesthesia Assessment:                           - Prior to the procedure, a History and Physical                            was performed, and patient medications and                            allergies were reviewed. The patient's tolerance of                            previous anesthesia was also reviewed. The risks                            and benefits of the procedure and the sedation                            options and risks were discussed with the patient.                            All questions were answered, and informed consent                            was obtained. Prior Anticoagulants: The patient has                            taken no previous anticoagulant or antiplatelet                            agents. ASA Grade Assessment: III - A patient with                            severe systemic disease. After reviewing the risks                            and benefits, the patient was deemed in                            satisfactory condition to undergo the procedure.                           After obtaining informed consent, the colonoscope  was passed under direct vision. Throughout the                            procedure, the patient's blood pressure, pulse, and                            oxygen saturations were monitored continuously. The                            Model CF-HQ190L [id number]) scope was introduced                            through the anus and advanced to the the cecum,                            identified by appendiceal orifice and  ileocecal                            valve. The colonoscopy was performed without                            difficulty. The patient tolerated the procedure                            well. The quality of the bowel preparation was                            excellent. The ileocecal valve, appendiceal                            orifice, and rectum were photographed. The quality                            of the bowel preparation was evaluated using the                            BBPS [facility] Bowel Preparation Scale) with scores                            of: Right Colon = 3, Transverse Colon = 3 and Left                            Colon = 3 (entire mucosa seen well with no residual                            staining, small fragments of stool or opaque                            liquid). The total BBPS score equals 9. The bowel                            preparation used was SUPREP. Scope In: 9:25:31 AM Scope Out: 9:43:00 AM Scope Withdrawal Time:  0 hours 13 minutes 57 seconds  Total Procedure Duration: 0 hours 17 minutes 29 seconds  Findings:                 The perianal and digital rectal examinations were                            normal.                           Multiple small and large-mouthed diverticula were                            found in the entire colon, especially in the left                            colon.                           An 8 mm polyp was found in the ascending colon. The                            polyp was flat with a mucus cap. The polyp was                            removed with a cold snare. Resection and retrieval                            were complete.                           A 6-8 mm polyp was found in the rectum. The polyp                            was semi-pedunculated. The polyp was removed with a                            hot snare. Resection and retrieval were complete.                           The exam was otherwise without abnormality  on                            direct and retroflexion views. Complications:            No immediate complications. Estimated Blood Loss:     Estimated blood loss was minimal. Impression:               - Diverticulosis in the entire examined colon.                           - One 8 mm polyp in the ascending colon, removed                            with a cold snare. Resected and retrieved.                           -  One 6-8 mm polyp in the rectum, removed with a                            hot snare. Resected and retrieved.                           - The examination was otherwise normal on direct                            and retroflexion views. Recommendation:           - Patient has a contact number available for                            emergencies. The signs and symptoms of potential                            delayed complications were discussed with the                            patient. Return to normal activities tomorrow.                            Written discharge instructions were provided to the                            patient.                           - Resume previous diet.                           - Continue present medications.                           - Await pathology results.                           - Repeat colonoscopy is recommended for                            surveillance. The colonoscopy date will be                            determined after pathology results from today's                            exam become available for review.  L. Loletha Carrow, MD 09/10/2017 9:47:46 AM This report has been signed electronically.

## 2017-09-10 NOTE — Patient Instructions (Addendum)
THANK YOU FOR ALLOWING [place] TO CARE FOR YOU TODAY!  HANDOUT GIVEN FOR POLYPS  AWAIT PATHOLOGY RESULTS  RESUME PREVIOUS DIET AND MEDICATIONS   YOU HAD AN ENDOSCOPIC PROCEDURE TODAY AT [facility]:   Refer to the procedure report that was given to you for any specific questions about what was found during the examination.  If the procedure report does not answer your questions, please call your gastroenterologist to clarify.  If you requested that your care partner not be given the details of your procedure findings, then the procedure report has been included in a sealed envelope for you to review at your convenience later.  YOU SHOULD EXPECT: Some feelings of bloating in the abdomen. Passage of more gas than usual.  Walking can help get rid of the air that was put into your GI tract during the procedure and reduce the bloating. If you had a lower endoscopy (such as a colonoscopy or flexible sigmoidoscopy) you may notice spotting of blood in your stool or on the toilet paper. If you underwent a bowel prep for your procedure, you may not have a normal bowel movement for a few days.  Please Note:  You might notice some irritation and congestion in your nose or some drainage.  This is from the oxygen used during your procedure.  There is no need for concern and it should clear up in a day or so.  SYMPTOMS TO REPORT IMMEDIATELY:   Following lower endoscopy (colonoscopy or flexible sigmoidoscopy):  Excessive amounts of blood in the stool  Significant tenderness or worsening of abdominal pains  Swelling of the abdomen that is new, acute  Fever of 100F or higher     For urgent or emergent issues, a gastroenterologist can be reached at any hour by calling [phone].   DIET:  We do recommend a small meal at first, but then you may proceed to your regular diet.  Drink plenty of fluids but you should avoid alcoholic beverages for 24 hours.  ACTIVITY:  You should plan to take  it easy for the rest of today and you should NOT DRIVE or use heavy machinery until tomorrow (because of the sedation medicines used during the test).    FOLLOW UP: Our staff will call the number listed on your records the next business day following your procedure to check on you and address any questions or concerns that you may have regarding the information given to you following your procedure. If we do not reach you, we will leave a message.  However, if you are feeling well and you are not experiencing any problems, there is no need to return our call.  We will assume that you have returned to your regular daily activities without incident.  If any biopsies were taken you will be contacted by phone or by letter within the next 1-3 weeks.  Please call us at [phone] if you have not heard about the biopsies in 3 weeks.    SIGNATURES/CONFIDENTIALITY: You and/or your care partner have signed paperwork which will be entered into your electronic medical record.  These signatures attest to the fact that that the information above on your After Visit Summary has been reviewed and is understood.  Full responsibility of the confidentiality of this discharge information lies with you and/or your care-partner.

## 2017-09-10 NOTE — Progress Notes (Signed)
To PACU, VSS. Report to [email or web address] 

## 2017-09-13 ENCOUNTER — Telehealth: Payer: Self-pay | Admitting: *Deleted

## 2017-09-13 NOTE — Telephone Encounter (Signed)
No answer for post procedure call back. LEft message for patient to call with questions or concerns. SM 

## 2017-09-13 NOTE — Telephone Encounter (Signed)
No answer for post procedure call back. Left message and will attempt to call back later this afternoon. SM 

## 2017-09-17 ENCOUNTER — Encounter: Payer: Self-pay | Admitting: Gastroenterology

## 2017-10-05 ENCOUNTER — Other Ambulatory Visit: Payer: Self-pay | Admitting: Family Medicine

## 2017-10-06 NOTE — Telephone Encounter (Signed)
Sent. Thanks.   

## 2017-10-06 NOTE — Telephone Encounter (Signed)
Electronic refill request Last refill 07/27/17 #40/2 Last office visit 07/12/17/acute

## 2017-10-21 ENCOUNTER — Telehealth: Payer: Self-pay

## 2017-10-21 NOTE — Telephone Encounter (Deleted)
Patient would like for Dr. Damita Dunnings to consider having him repeat the fecal occult testing before moving forward with a GI referral.  He does have some bleeding hemorrhoids and knows that he did not follow the directions in collecting the sample and thinks that the way he collected could have resulted in the reading.  He did not want to elaborate on how he collected but did say it was not as he was instructed.  He will go to GI if necessary but would like to have the repeat test to be sure he didn't do anything to interfere with accuracy of results.    Please advise.

## 2017-10-22 NOTE — Telephone Encounter (Signed)
Please talk to me about this.  Thanks.  This patient recently had colonoscopy.

## 2017-10-26 NOTE — Telephone Encounter (Signed)
Discussed with PCP, clarification received.  Nothing further is needed at this point.  Thanks.

## 2017-11-01 ENCOUNTER — Other Ambulatory Visit: Payer: Self-pay | Admitting: Family Medicine

## 2017-11-02 NOTE — Telephone Encounter (Signed)
Electronic refill request Last refill 01/20/17 #90/3 Last office visit 07/12/17/acute

## 2017-11-04 NOTE — Telephone Encounter (Signed)
Sent. Thanks.   

## 2017-11-08 ENCOUNTER — Other Ambulatory Visit: Payer: Self-pay | Admitting: Family Medicine

## 2017-11-08 NOTE — Telephone Encounter (Signed)
Electronic refill request Last refill 06/04/17 #30/2 Last office visit 07/22/17/acute

## 2017-11-09 NOTE — Telephone Encounter (Signed)
Sent. Thanks.   

## 2017-11-12 ENCOUNTER — Encounter: Payer: Self-pay | Admitting: Family Medicine

## 2017-11-12 ENCOUNTER — Ambulatory Visit (INDEPENDENT_AMBULATORY_CARE_PROVIDER_SITE_OTHER): Payer: BLUE CROSS/BLUE SHIELD | Admitting: Family Medicine

## 2017-11-12 DIAGNOSIS — L989 Disorder of the skin and subcutaneous tissue, unspecified: Secondary | ICD-10-CM

## 2017-11-12 NOTE — Progress Notes (Signed)
Cyst versus lipoma versus scar tissue noted on the right upper back near the base of the neck, posterior/lateral.  He was asking about options.  It small, less than 1 cm.  Not red or draining.  See plan.   nad ncat Cyst versus lipoma versus scar tissue noted on the right upper back near the base of the neck, posterior/lateral.  No LA.  Lesion is <1cm.

## 2017-11-12 NOTE — Patient Instructions (Signed)
No charge on the visit.   Get your money back.   Use warm compresses twice a day (or hot showers) and then if not better, then I'll drain it.  Ask for a procedure visit and I'll work on it.  Take care.  Glad to see you.

## 2017-11-14 DIAGNOSIS — L989 Disorder of the skin and subcutaneous tissue, unspecified: Secondary | ICD-10-CM | POA: Insufficient documentation

## 2017-11-14 NOTE — Assessment & Plan Note (Addendum)
Likely a small sebaceous cyst.  Less likely to be a lipoma or scar tissue.  Discussed with patient about options.  Since it is so small it is likely more reasonable for him to use warm compresses twice a day and see if it was spontaneously drain and resolve on his own.  If it does not do that then we will get him set up for a procedure visit and I can either shave it off or incise and drain the area.  Rationale discussed with patient.  I do not want to put him through the procedure if he does not need to have it done, but this is something we can take care of on outpatient basis if needed.  Update me as needed.  He agrees.  Since I did not intervene, no charge for the visit.

## 2017-12-28 ENCOUNTER — Other Ambulatory Visit: Payer: Self-pay | Admitting: Family Medicine

## 2017-12-28 NOTE — Telephone Encounter (Signed)
Electronic refill request Last refill 11/04/17 #90/1 Last office visit 11/12/17

## 2017-12-29 NOTE — Telephone Encounter (Signed)
Sent. Thanks.   

## 2018-01-23 ENCOUNTER — Other Ambulatory Visit: Payer: Self-pay | Admitting: Family Medicine

## 2018-01-24 NOTE — Telephone Encounter (Signed)
Electronic refill request. Tramadol Last office visit:   11/12/2017 Last Filled:    40 tablet 2 10/06/2017  Please advise.

## 2018-01-25 NOTE — Telephone Encounter (Signed)
Sent. Thanks.  Due for CPE when possible.  

## 2018-01-25 NOTE — Telephone Encounter (Signed)
Left message for patient to call back and schedule-Anastasiya V Hopkins, RMA

## 2018-01-25 NOTE — Telephone Encounter (Signed)
Please schedule appointment as instructed. 

## 2018-01-26 NOTE — Telephone Encounter (Signed)
Appointment scheduled-Anastasiya Estell Harpin, RMA

## 2018-02-07 ENCOUNTER — Ambulatory Visit: Payer: BLUE CROSS/BLUE SHIELD | Admitting: Family Medicine

## 2018-02-07 ENCOUNTER — Encounter: Payer: Self-pay | Admitting: Family Medicine

## 2018-02-07 DIAGNOSIS — K1370 Unspecified lesions of oral mucosa: Secondary | ICD-10-CM | POA: Diagnosis not present

## 2018-02-07 DIAGNOSIS — R05 Cough: Secondary | ICD-10-CM

## 2018-02-07 DIAGNOSIS — R059 Cough, unspecified: Secondary | ICD-10-CM

## 2018-02-07 MED ORDER — AMOXICILLIN 875 MG PO TABS: 875.0000 mg | ORAL_TABLET | Freq: Two times a day (BID) | ORAL | 0 refills | Status: DC

## 2018-02-07 MED ORDER — BENZONATATE 200 MG PO CAPS: 200.0000 mg | ORAL_CAPSULE | Freq: Three times a day (TID) | ORAL | 2 refills | Status: DC | PRN

## 2018-02-07 NOTE — Progress Notes (Signed)
He forgot his BP meds this AM.  D/w pt.  He can check BP at home and update me as needed.    Sore near the roof of mouth.  L side of mouth, lateral to the gum line, on the buccal side.  He had been eating chips when one of the chips likely cut the tissue.  No fevers.  Locally sore.  He started taking his wife's amoxil in the meantime with dec in soreness and local irritation. Sx not resolved so he came in for eval.  No fevers.  No other lesions.  Swallowing well.     Cough.  Ctab.  Needed refill on tessalon for prn use.   Meds, vitals, and allergies reviewed.   ROS: Per HPI unless specifically indicated in ROS section   nad TM wnl Nasal exam wnl. OP wnl except for linear lesion on the L upper side of mouth, lateral to the gum line, on the buccal side.  Small, locally irritation, w/o discharge.  No fluctuant mass.  Not puffy.  No facial swelling.   Neck supple, no LA rrr ctab

## 2018-02-07 NOTE — Patient Instructions (Signed)
Amoxil 875 twice a day.  Rinse with saline.  Use tessalon as needed.  Update me as needed.  Take care.  Glad to see you.

## 2018-02-07 NOTE — Assessment & Plan Note (Signed)
Likely improving, continue amoxil 875mg  BID for now.  Rinse with saline.  Rinsing with peroxide may slow healing, d/w pt.  Update me as needed.  Less tender now.  Doesn't appear to involve any of the teeth locally.  He agrees with plan.

## 2018-02-07 NOTE — Assessment & Plan Note (Signed)
Can use tessalon prn and update me as needed.  Ctab.  He agrees.

## 2018-02-24 ENCOUNTER — Encounter: Payer: BLUE CROSS/BLUE SHIELD | Admitting: Family Medicine

## 2018-03-06 ENCOUNTER — Other Ambulatory Visit: Payer: Self-pay | Admitting: Family Medicine

## 2018-03-07 NOTE — Telephone Encounter (Signed)
Electronic refill request Soma Last refill 11/09/17 #30/2 Last office visit 02/07/18

## 2018-03-08 NOTE — Telephone Encounter (Signed)
Sent. Thanks.   

## 2018-03-24 ENCOUNTER — Other Ambulatory Visit: Payer: Self-pay | Admitting: Family Medicine

## 2018-04-19 ENCOUNTER — Other Ambulatory Visit: Payer: Self-pay | Admitting: Family Medicine

## 2018-04-19 NOTE — Telephone Encounter (Signed)
Electronic refill request. Tramadol Last office visit:   02/07/18 Acute Last Filled:    40 tablet 2 01/25/2018  Please advise.

## 2018-04-20 NOTE — Telephone Encounter (Signed)
Sent. Thanks.   

## 2018-04-28 ENCOUNTER — Other Ambulatory Visit: Payer: Self-pay | Admitting: Family Medicine

## 2018-04-28 NOTE — Telephone Encounter (Signed)
Electronic refill request. Ibuprofen 800 mg Last office visit:   02/07/18 Last Filled:    90 tablet 1 12/29/2017  Please advise.

## 2018-04-29 NOTE — Telephone Encounter (Signed)
Sent. Thanks.   

## 2018-05-01 ENCOUNTER — Other Ambulatory Visit: Payer: Self-pay | Admitting: Family Medicine

## 2018-05-01 DIAGNOSIS — I10 Essential (primary) hypertension: Secondary | ICD-10-CM

## 2018-05-06 ENCOUNTER — Other Ambulatory Visit: Payer: Self-pay | Admitting: Family Medicine

## 2018-05-06 ENCOUNTER — Other Ambulatory Visit: Payer: BLUE CROSS/BLUE SHIELD

## 2018-05-09 NOTE — Telephone Encounter (Signed)
Electronic refill request. Carisoprodol Last office visit:   02/07/18 Acute  Has CPE appt scheduled in Jan Last Filled:    30 tablet 1 03/08/2018  Please advise.

## 2018-05-11 NOTE — Telephone Encounter (Signed)
Sent. Thanks.   

## 2018-05-12 ENCOUNTER — Encounter: Payer: BLUE CROSS/BLUE SHIELD | Admitting: Family Medicine

## 2018-05-12 ENCOUNTER — Telehealth: Payer: Self-pay | Admitting: *Deleted

## 2018-05-12 NOTE — Telephone Encounter (Signed)
PA submitted for Carisoprodol thru CMM, awaiting response.

## 2018-05-16 ENCOUNTER — Other Ambulatory Visit (INDEPENDENT_AMBULATORY_CARE_PROVIDER_SITE_OTHER): Payer: BLUE CROSS/BLUE SHIELD

## 2018-05-16 DIAGNOSIS — I10 Essential (primary) hypertension: Secondary | ICD-10-CM

## 2018-05-16 NOTE — Addendum Note (Signed)
Addended by: Ellamae Sia on: 05/16/2018 08:15 AM   Modules accepted: Orders

## 2018-05-16 NOTE — Addendum Note (Signed)
Addended by: Ellamae Sia on: 05/16/2018 08:14 AM   Modules accepted: Orders

## 2018-05-17 LAB — COMPREHENSIVE METABOLIC PANEL
ALT: 23 IU/L (ref 0–44)
AST: 19 IU/L (ref 0–40)
Albumin/Globulin Ratio: 2 (ref 1.2–2.2)
Albumin: 4.5 g/dL (ref 3.6–4.8)
Alkaline Phosphatase: 71 IU/L (ref 39–117)
BUN/Creatinine Ratio: 34 — ABNORMAL HIGH (ref 10–24)
BUN: 26 mg/dL (ref 8–27)
Bilirubin Total: 0.3 mg/dL (ref 0.0–1.2)
CO2: 24 mmol/L (ref 20–29)
Calcium: 9.2 mg/dL (ref 8.6–10.2)
Chloride: 103 mmol/L (ref 96–106)
Creatinine, Ser: 0.77 mg/dL (ref 0.76–1.27)
GFR calc Af Amer: 113 mL/min/{1.73_m2} (ref >59)
GFR calc non Af Amer: 98 mL/min/{1.73_m2} (ref >59)
Globulin, Total: 2.2 g/dL (ref 1.5–4.5)
Glucose: 88 mg/dL (ref 65–99)
Potassium: 4.9 mmol/L (ref 3.5–5.2)
Sodium: 141 mmol/L (ref 134–144)
Total Protein: 6.7 g/dL (ref 6.0–8.5)

## 2018-05-17 LAB — LIPID PANEL
Chol/HDL Ratio: 5.9 ratio — ABNORMAL HIGH (ref 0.0–5.0)
Cholesterol, Total: 201 mg/dL — ABNORMAL HIGH (ref 100–199)
HDL: 34 mg/dL — ABNORMAL LOW (ref >39)
LDL Calculated: 138 mg/dL — ABNORMAL HIGH (ref 0–99)
Triglycerides: 143 mg/dL (ref 0–149)
VLDL Cholesterol Cal: 29 mg/dL (ref 5–40)

## 2018-05-17 NOTE — Telephone Encounter (Signed)
Received letter from insurance denying coverage.  Left message for patient and letter scanned.

## 2018-05-24 ENCOUNTER — Ambulatory Visit (INDEPENDENT_AMBULATORY_CARE_PROVIDER_SITE_OTHER): Payer: BLUE CROSS/BLUE SHIELD | Admitting: Family Medicine

## 2018-05-24 ENCOUNTER — Encounter: Payer: Self-pay | Admitting: Family Medicine

## 2018-05-24 VITALS — BP 142/84 | HR 78 | Temp 98.4°F | Ht 69.0 in | Wt 281.5 lb

## 2018-05-24 DIAGNOSIS — I10 Essential (primary) hypertension: Secondary | ICD-10-CM

## 2018-05-24 DIAGNOSIS — M62838 Other muscle spasm: Secondary | ICD-10-CM

## 2018-05-24 DIAGNOSIS — Z87891 Personal history of nicotine dependence: Secondary | ICD-10-CM

## 2018-05-24 DIAGNOSIS — Z7189 Other specified counseling: Secondary | ICD-10-CM

## 2018-05-24 DIAGNOSIS — M25569 Pain in unspecified knee: Secondary | ICD-10-CM

## 2018-05-24 DIAGNOSIS — Z0000 Encounter for general adult medical examination without abnormal findings: Secondary | ICD-10-CM | POA: Diagnosis not present

## 2018-05-24 MED ORDER — LISINOPRIL 20 MG PO TABS: ORAL_TABLET | ORAL | 3 refills | Status: DC

## 2018-05-24 MED ORDER — TIZANIDINE HCL 4 MG PO TABS: 2.0000 mg | ORAL_TABLET | Freq: Four times a day (QID) | ORAL | 2 refills | Status: DC | PRN

## 2018-05-24 NOTE — Patient Instructions (Addendum)
Check with your insurance to see if they will cover the shrigrix shot.  We make arrangements for referrals, extra imaging, and other appointments based on the urgency of the situation. Referrals are handled based on the clinical situation, not in the order that they are placed. If you do not see one of our referral coordinators on the way out of the clinic today, then you should expect a call in the next 1 to 2 weeks. We work diligently to process all referrals as quickly as possible.    Try tizanidine instead of soma.  Update me as needed.  Take care.  Glad to see you.

## 2018-05-24 NOTE — Progress Notes (Signed)
CPE- See plan.  Routine anticipatory guidance given to patient.  See health maintenance.  The possibility exists that previously documented standard health maintenance information may have been brought forward from a previous encounter into this note.  If needed, that same information has been updated to reflect the current situation based on today's encounter.    Tetanus 2017  PNA 2012  Flu yearly  Shingles shot d/w pt.  Prostate cancer screening and PSA options (with potential risks and benefits of testing vs not testing) were discussed along with recent recs/guidelines.  He declined testing PSA at this point.  He can update me if desired in the future.   Colonoscopy 2019 Advance directive d/w pt. Would have his wife designated if patient were incapacitated. Encouraged living will.  HCV prev neg. dw pt.  HIV neg.  D/w pt.    Smoked for 30 pack years.  We talked about lung cancer screening.   Refer.  His father died of brain cancer in May 12, 2018 and I offered my condolences.  D/w pt.    Hypertension:    Using medication without problems or lightheadedness: yes Chest pain with exertion:no Edema:no Short of breath:no Labs d/w pt.    Tramadol helps knee pain some.  No ADE on med.    Chronic neck and shoulder pain, right.  D/w pt about muscle relaxer use.  No ADE on med.  He clearly didn't tolerate flexeril.  He hasn't used tizanidine.   Taking 1-2 ibuprofen daily.  Taking tramadol for pain.  His insurance company is not going to cover soma unless he is tried and failed tizanidine and Flexeril.  He had no adverse effect on soma, and Soma did help his symptoms some.  It may still be reasonable to try tizanidine.  PMH and SH reviewed  Meds, vitals, and allergies reviewed.   ROS: Per HPI.  Unless specifically indicated otherwise in HPI, the patient denies:  General: fever. Eyes: acute vision changes ENT: sore throat Cardiovascular: chest pain Respiratory: SOB GI: vomiting GU:  dysuria Musculoskeletal: acute back pain Derm: acute rash Neuro: acute motor dysfunction Psych: worsening mood Endocrine: polydipsia Heme: bleeding Allergy: hayfever  GEN: nad, alert and oriented HEENT: mucous membranes moist NECK: supple w/o LA CV: rrr. PULM: ctab, no inc wob ABD: soft, +bs EXT: no edema SKIN: no acute rash

## 2018-05-25 NOTE — Assessment & Plan Note (Signed)
No ADE on soma.  He clearly didn't tolerate flexeril.  He hasn't used tizanidine.   Taking 1-2 ibuprofen daily.  Taking tramadol for pain.  His insurance company is not going to cover soma unless he is tried and failed tizanidine and Flexeril.  He had no adverse effect on soma, and Soma did help his symptoms some.  It may still be reasonable to try tizanidine.  Discussed.  Prescription sent.  Routine cautions given.  He will update me as needed.

## 2018-05-25 NOTE — Assessment & Plan Note (Addendum)
Tramadol helps knee pain some.  No ADE on med.   Continue as is.  He agrees.

## 2018-05-25 NOTE — Assessment & Plan Note (Signed)
Advance directive d/w pt. Would have his wife designated if patient were incapacitated. Encouraged living will.

## 2018-05-25 NOTE — Assessment & Plan Note (Signed)
No change in total dose of lisinopril.  He can change to 1 of the 20 mg tabs instead of taking 2 of the 10 mg tabs.  Continue work on diet and exercise.  Labs discussed with patient.  He agrees.  I want him to work on diet and exercise before we consider statin.  I would also like to address his muscle spasms before considering statin use.

## 2018-05-25 NOTE — Assessment & Plan Note (Addendum)
Tetanus 2017  PNA 2012  Flu yearly  Shingles shot d/w pt.  Prostate cancer screening and PSA options (with potential risks and benefits of testing vs not testing) were discussed along with recent recs/guidelines.  He declined testing PSA at this point.  He can update me if desired in the future.   Colonoscopy 2019 Advance directive d/w pt. Would have his wife designated if patient were incapacitated. Encouraged living will.  HCV prev neg. dw pt.  HIV neg.  D/w pt.    Smoked for 30 pack years.  We talked about lung cancer screening.   Refer.

## 2018-05-27 ENCOUNTER — Telehealth: Payer: Self-pay | Admitting: *Deleted

## 2018-05-27 NOTE — Telephone Encounter (Signed)
PA for Tramadol submitted thru CMM, awaiting response.

## 2018-06-03 ENCOUNTER — Telehealth: Payer: Self-pay | Admitting: *Deleted

## 2018-06-03 DIAGNOSIS — Z87891 Personal history of nicotine dependence: Secondary | ICD-10-CM

## 2018-06-03 DIAGNOSIS — Z122 Encounter for screening for malignant neoplasm of respiratory organs: Secondary | ICD-10-CM

## 2018-06-03 NOTE — Telephone Encounter (Signed)
Received referral for initial lung cancer screening scan. Contacted patient and obtained smoking history,(former, quit 08/26/08, 41.25 pack year) as well as answering questions related to screening process. Patient denies signs of lung cancer such as weight loss or hemoptysis. Patient denies comorbidity that would prevent curative treatment if lung cancer were found. Patient is scheduled for shared decision making visit and CT scan on 06/17/18 at 230pm.

## 2018-06-03 NOTE — Telephone Encounter (Signed)
Received referral for lung screening scan. Patient contacted and requests to call back today after 4pm.

## 2018-06-14 ENCOUNTER — Other Ambulatory Visit: Payer: Self-pay | Admitting: Family Medicine

## 2018-06-15 ENCOUNTER — Telehealth: Payer: Self-pay | Admitting: *Deleted

## 2018-06-15 NOTE — Telephone Encounter (Signed)
Called pt to remind him of his appt for ldct screening on Friday 06-17-2018 @1430 , voiced understanding.

## 2018-06-17 ENCOUNTER — Ambulatory Visit: Admission: RE | Admit: 2018-06-17 | Payer: BLUE CROSS/BLUE SHIELD | Source: Ambulatory Visit

## 2018-06-17 ENCOUNTER — Encounter: Payer: Self-pay | Admitting: Oncology

## 2018-06-17 ENCOUNTER — Inpatient Hospital Stay: Payer: BLUE CROSS/BLUE SHIELD | Attending: Nurse Practitioner | Admitting: Oncology

## 2018-06-20 ENCOUNTER — Inpatient Hospital Stay (HOSPITAL_BASED_OUTPATIENT_CLINIC_OR_DEPARTMENT_OTHER): Payer: BLUE CROSS/BLUE SHIELD | Admitting: Nurse Practitioner

## 2018-06-20 ENCOUNTER — Ambulatory Visit
Admission: RE | Admit: 2018-06-20 | Discharge: 2018-06-20 | Disposition: A | Discharge status: Home / Self Care | Payer: BLUE CROSS/BLUE SHIELD | Source: Ambulatory Visit | Attending: Nurse Practitioner | Admitting: Nurse Practitioner

## 2018-06-20 DIAGNOSIS — K760 Fatty (change of) liver, not elsewhere classified: Secondary | ICD-10-CM | POA: Diagnosis not present

## 2018-06-20 DIAGNOSIS — Z87891 Personal history of nicotine dependence: Secondary | ICD-10-CM | POA: Diagnosis not present

## 2018-06-20 DIAGNOSIS — I700 Atherosclerosis of aorta: Secondary | ICD-10-CM | POA: Insufficient documentation

## 2018-06-20 DIAGNOSIS — Z122 Encounter for screening for malignant neoplasm of respiratory organs: Secondary | ICD-10-CM

## 2018-06-20 NOTE — Progress Notes (Signed)
In accordance with CMS guidelines, patient has met eligibility criteria including age, absence of signs or symptoms of lung cancer.  Social History   Tobacco Use  . Smoking status: Former Smoker    Packs/day: 1.25    Years: 33.00    Pack years: 41.25    Last attempt to quit: 08/26/2008    Years since quitting: 9.8  . Smokeless tobacco: Never Used  . Tobacco comment: quit in ~ 2010 with Chantix  Substance Use Topics  . Alcohol use: Yes    Comment: occ on weekend, rare  . Drug use: No      A shared decision-making session was conducted prior to the performance of CT scan. This includes one or more decision aids, includes benefits and harms of screening, follow-up diagnostic testing, over-diagnosis, false positive rate, and total radiation exposure.   Counseling on the importance of adherence to annual lung cancer LDCT screening, impact of co-morbidities, and ability or willingness to undergo diagnosis and treatment is imperative for compliance of the program.   Counseling on the importance of continued smoking cessation for former smokers; the importance of smoking cessation for current smokers, and information about tobacco cessation interventions have been given to patient including [place] and [facility] quit [place] programs.   Written order for lung cancer screening with LDCT has been given to the patient and any and all questions have been answered to the best of my abilities.    Yearly follow up will be coordinated by Burgess Estelle, Thoracic Navigator.  Beckey Rutter, DNP, AGNP-C [place] [facility] [phone] (work cell) [phone] (office) 06/20/18 2:30 PM

## 2018-06-22 ENCOUNTER — Telehealth: Payer: Self-pay | Admitting: Family Medicine

## 2018-06-22 NOTE — Telephone Encounter (Signed)
Call patient.  I got the results from the chest CT.  Overall good news about lung cancer screening. Continue annual screening with low-dose chest CT without contrast in 12 months.  He also has atherosclerotic changes in his aorta and some mild fatty liver changes.  Treatment for both is to continue working on his blood pressure along with diet/exercise/gradual weight loss.  This was incidentally noted and a can be followed on the CAT scan next year.  In the meantime it is reasonable to consider starting a low-dose of a statin to try to decrease his cholesterol.  That could help with the liver changes and also to limit the atherosclerotic changes in his aorta.  Have him think about it.  I would start taking a low dose of Lipitor if he can tolerate it (noted that it can cause some muscle aches and if he had increased aches on the medicine he would stop it).  If he wants to come in and talk about it then please set up an office visit.  Let me know which way he wants to go.  Thanks.

## 2018-06-23 ENCOUNTER — Encounter: Payer: Self-pay | Admitting: *Deleted

## 2018-06-23 ENCOUNTER — Telehealth: Payer: Self-pay | Admitting: *Deleted

## 2018-06-23 NOTE — Telephone Encounter (Signed)
Notified patient of LDCT lung cancer screening program results with recommendation for 12 month follow up imaging.  Also notified of incidental findings noted below and is encouraged to discuss further questions with PCP who will receive a copy of this not and/or the CT reports.  Patient verbalized understanding.    IMPRESSION: 1. Lung-RADS 1, negative. Continue annual screening with low-dose chest CT without contrast in 12 months. 2.  Aortic Atherosclerosis (ICD10-I70.0). 3. Mild hepatic steatosis.

## 2018-06-23 NOTE — Telephone Encounter (Signed)
Noted. Thanks.

## 2018-06-23 NOTE — Telephone Encounter (Signed)
Patient advised.  Patient wishes to think about the low dose statin medication and will get back with [place] either by phone call or OV.

## 2018-07-02 ENCOUNTER — Encounter: Payer: Self-pay | Admitting: *Deleted

## 2018-07-22 ENCOUNTER — Other Ambulatory Visit: Payer: Self-pay | Admitting: Family Medicine

## 2018-07-22 NOTE — Telephone Encounter (Signed)
Electronic refill request. Tramadol Last office visit:   05/24/2018 Last Filled:    40 tablet 2 04/20/2018  Please advise.

## 2018-07-24 NOTE — Telephone Encounter (Signed)
Sent. Thanks.   

## 2018-08-25 ENCOUNTER — Other Ambulatory Visit: Payer: Self-pay | Admitting: Family Medicine

## 2018-09-28 ENCOUNTER — Other Ambulatory Visit: Payer: Self-pay | Admitting: Family Medicine

## 2018-10-19 ENCOUNTER — Other Ambulatory Visit: Payer: Self-pay | Admitting: Family Medicine

## 2018-10-19 NOTE — Telephone Encounter (Signed)
Last office visit 05/24/2018 for CPE.  Last refilled 08/25/2018 for #90 with 1 refill.  No future appointments.

## 2018-10-19 NOTE — Telephone Encounter (Signed)
Okay to continue, sent.  Thanks.  

## 2018-10-25 ENCOUNTER — Other Ambulatory Visit: Payer: Self-pay | Admitting: Family Medicine

## 2018-10-25 NOTE — Telephone Encounter (Signed)
Electronic refill request. Tramadol Last office visit:   05/24/2018 Last Filled:    40 tablet 2 07/24/2018  Please advise.

## 2018-10-26 NOTE — Telephone Encounter (Signed)
Sent. Thanks.   

## 2018-12-11 ENCOUNTER — Other Ambulatory Visit: Payer: Self-pay | Admitting: Family Medicine

## 2018-12-13 ENCOUNTER — Other Ambulatory Visit: Payer: Self-pay | Admitting: Family Medicine

## 2018-12-14 NOTE — Telephone Encounter (Signed)
Please verify with patient to make sure this was not an auto refill.  Thanks.

## 2018-12-14 NOTE — Telephone Encounter (Addendum)
Electronic refill request Juan Thompson Last office visit 05/26/18 Medication no longer on medication list Note shows possible duplicate

## 2018-12-15 MED ORDER — CARISOPRODOL 350 MG PO TABS: ORAL_TABLET | ORAL | 1 refills | Status: DC

## 2018-12-15 NOTE — Telephone Encounter (Signed)
Spoke with patient. He states that he did want to start Soma instead of Tizanidine. Tizanidine makes him more sedated and he can not take it during the day. He does better with Soma when he tried it before. Please review

## 2018-12-15 NOTE — Telephone Encounter (Signed)
Sent. Thanks.  Med and intolerance list updated.

## 2018-12-20 ENCOUNTER — Telehealth: Payer: Self-pay

## 2018-12-20 NOTE — Telephone Encounter (Signed)
PA for Soma approved for dates: 12/20/2018-12/19/2019. Pharmacy advised.

## 2019-01-31 ENCOUNTER — Other Ambulatory Visit: Payer: Self-pay | Admitting: Family Medicine

## 2019-01-31 NOTE — Telephone Encounter (Signed)
Electronic refill request. Tramadol Last office visit:   05/24/2018 CPE Last Filled:    40 tablet 2 10/26/2018  Please advise.

## 2019-02-01 NOTE — Telephone Encounter (Signed)
Sent. Thanks.   

## 2019-03-27 IMAGING — CT CT CHEST LUNG CANCER SCREENING LOW DOSE W/O CM
2 of 5 series · 15 of 40 positions shown, 18 images · non-contrast
Comparison: None.

CLINICAL DATA: Asymptomatic ex-smoker, quitting 10 years ago. 41
pack-year history.

EXAM:
CT CHEST WITHOUT CONTRAST LOW-DOSE FOR LUNG CANCER SCREENING
TECHNIQUE: Multidetector CT imaging of the chest was performed following the
standard protocol without IV contrast.

[Series 4: lung · axial · 0.70mm/px · z∈[-1221,-908]mm · 12 of 347 slices shown, 15 images (1 of 2)]
[im 17/347  mediastinal]
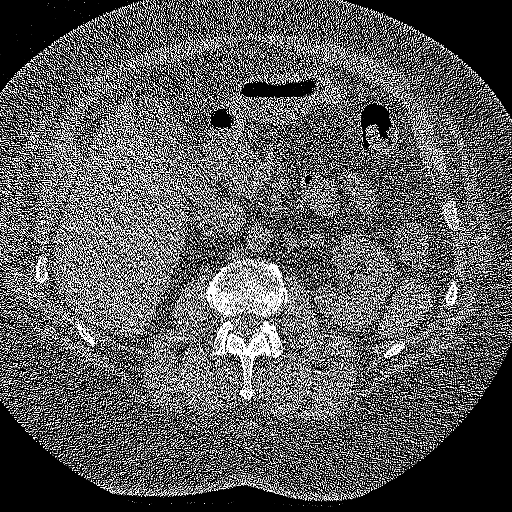
[im 17/347  lung]
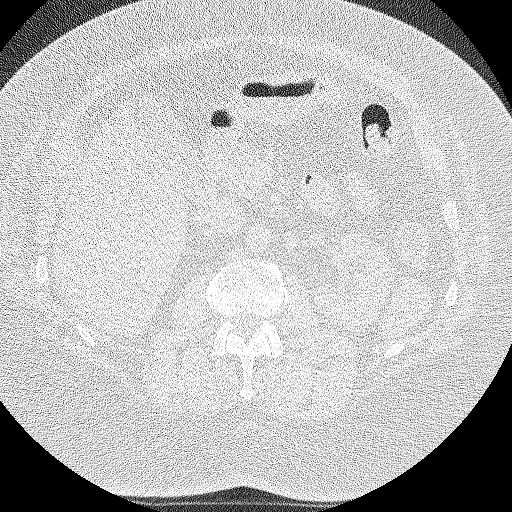
[im 50/347  lung]
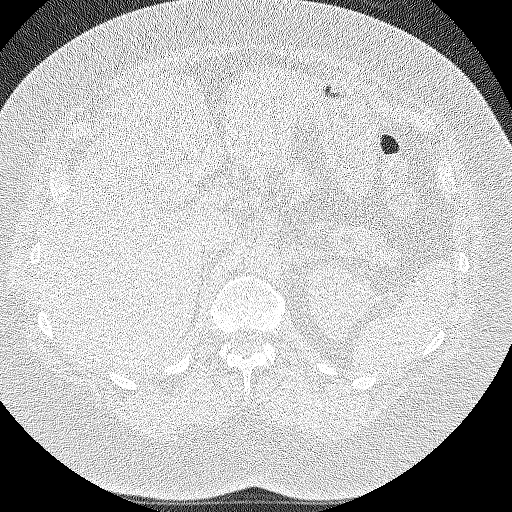
[im 83/347  lung]
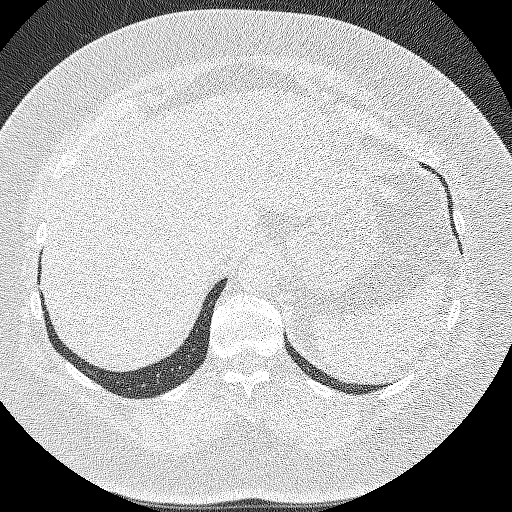
[im 99/347  lung]
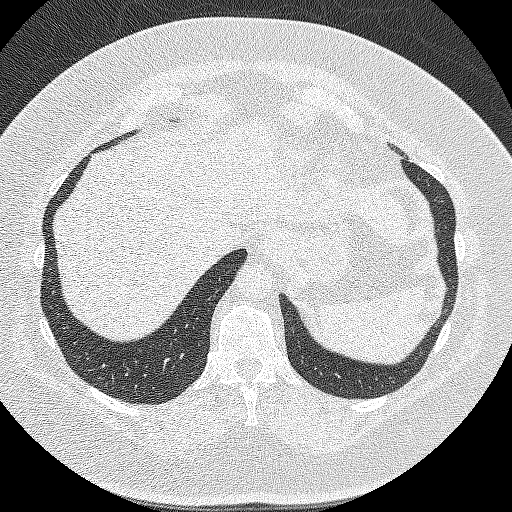
[im 132/347  mediastinal]
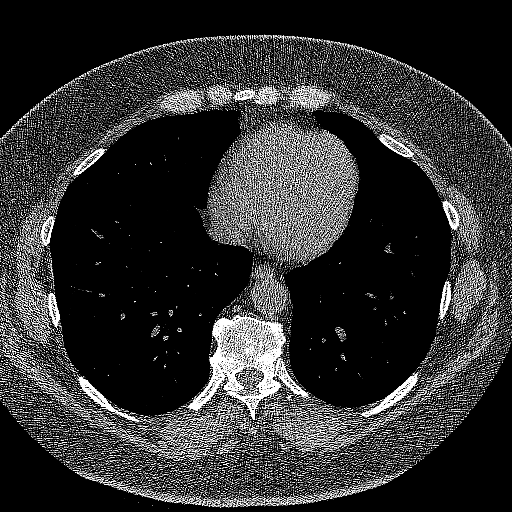
[im 132/347  lung]
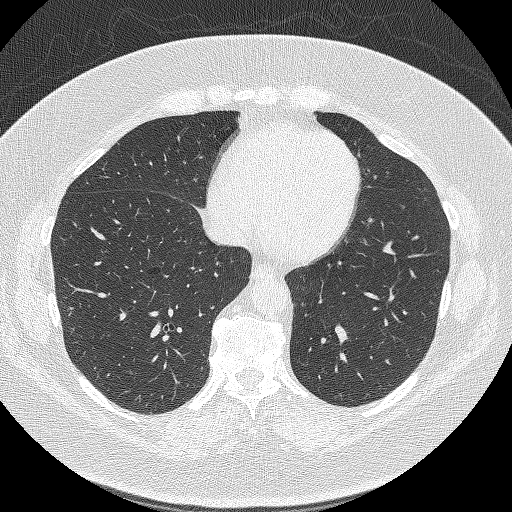
[im 165/347  lung]
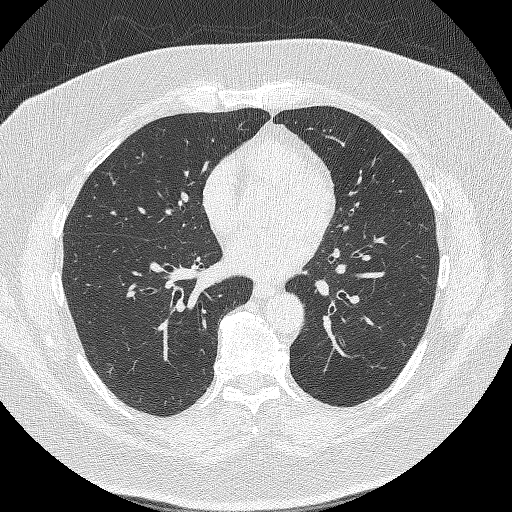
[im 182/347  lung]
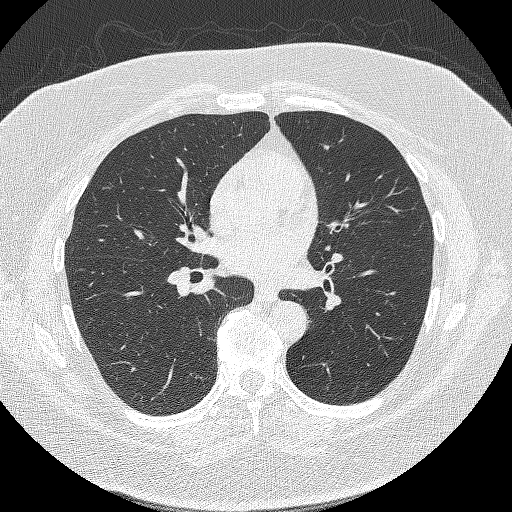
[im 215/347  lung]
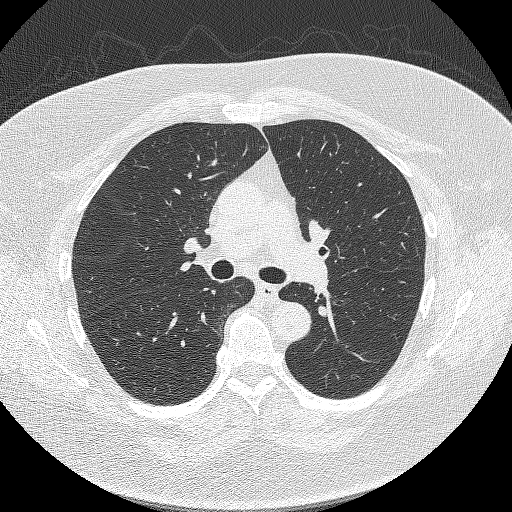
[im 248/347  mediastinal]
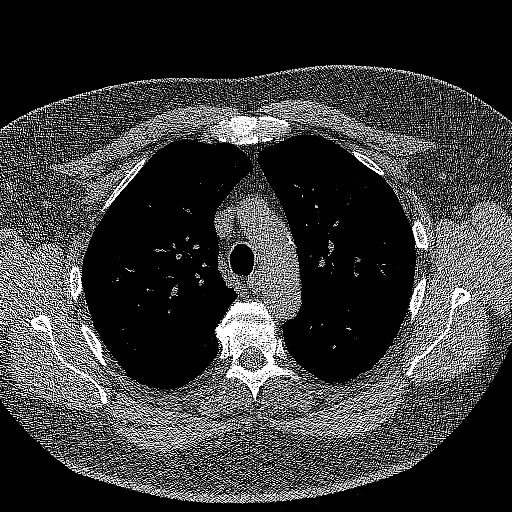
[im 248/347  lung]
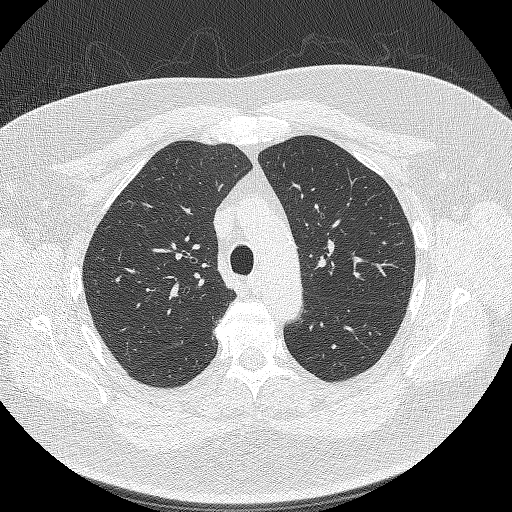
[im 264/347  lung]
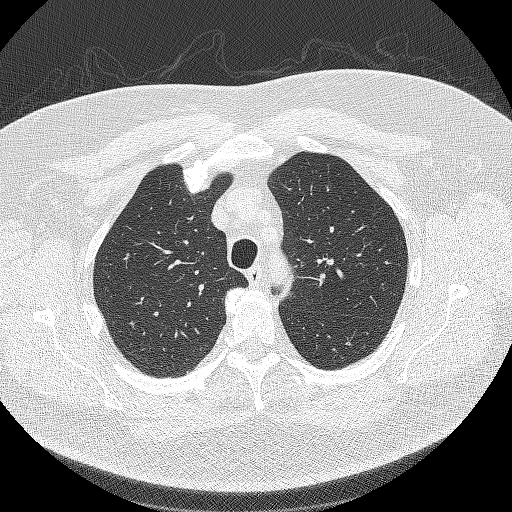
[im 297/347  lung]
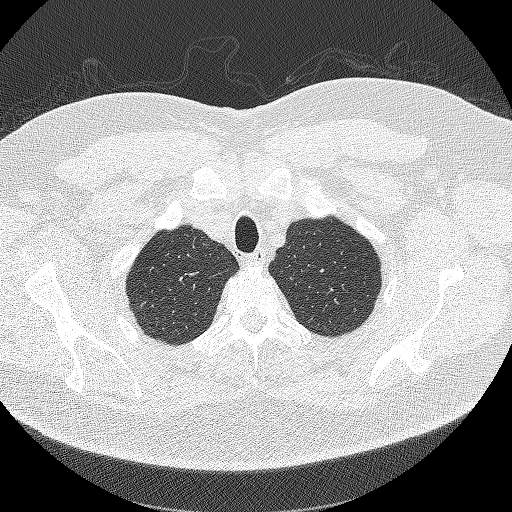
[im 330/347  lung]
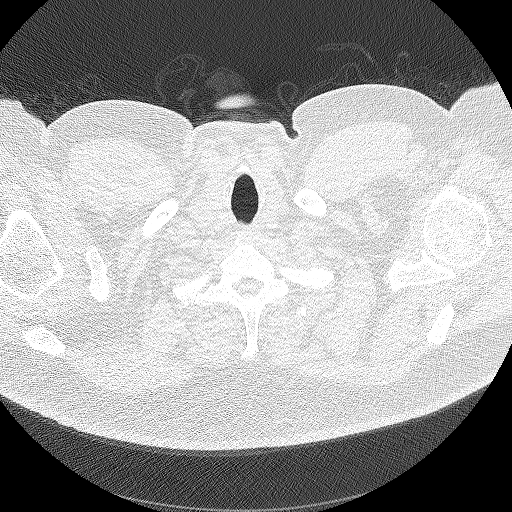

[Series 5: lung · coronal · 0.68mm/px · 3 of 357 slices shown (2 of 2)]
[im 72/357  lung]
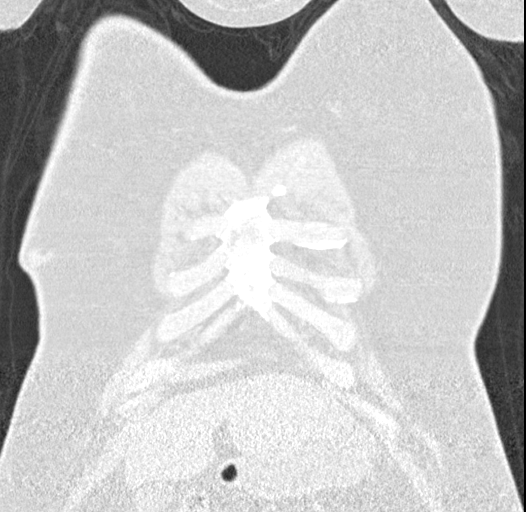
[im 143/357  lung]
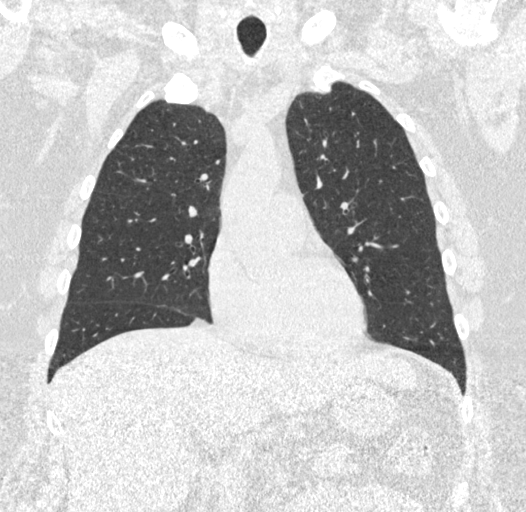
[im 214/357  lung]
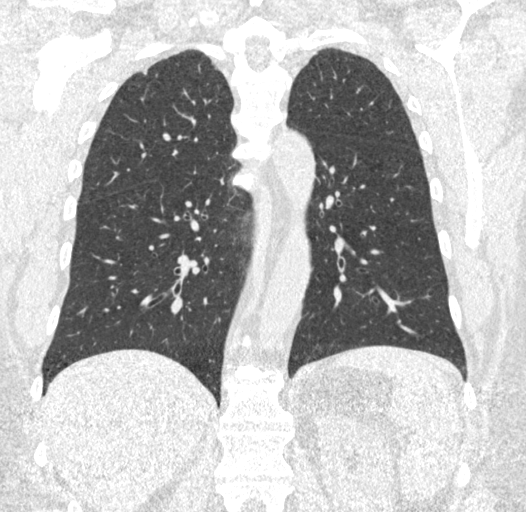

[15 of 40 positions shown; findings below may reference images not displayed]

FINDINGS: Cardiovascular: Aortic atherosclerosis. Tortuous thoracic aorta.
Normal heart size, without pericardial effusion.

Mediastinum/Nodes: No mediastinal or definite hilar adenopathy,
given limitations of unenhanced CT.

Lungs/Pleura: No pleural fluid. No suspicious pulmonary nodule or
mass.

Upper Abdomen: Mild hepatic steatosis. Normal imaged portions of the
spleen, stomach, pancreas, gallbladder, adrenal glands, left kidney.
Abdominal aortic atherosclerosis.

Musculoskeletal: Moderate thoracic spondylosis.
IMPRESSION: 1. Lung-RADS 1, negative. Continue annual screening with low-dose
chest CT without contrast in 12 months.
2.  Aortic Atherosclerosis ([id number]).
3. Mild hepatic steatosis.

## 2019-04-09 ENCOUNTER — Other Ambulatory Visit: Payer: Self-pay | Admitting: Family Medicine

## 2019-05-06 ENCOUNTER — Other Ambulatory Visit: Payer: Self-pay | Admitting: Family Medicine

## 2019-05-08 NOTE — Telephone Encounter (Signed)
Rx was last refilled on 10/19/18 for #90 with 1 refill. Patient was last seen on 1/14 with an upcoming CPE on 06/02/19. Is this ok to refill?

## 2019-05-09 NOTE — Telephone Encounter (Signed)
Sent. Thanks.   

## 2019-05-11 ENCOUNTER — Other Ambulatory Visit: Payer: Self-pay | Admitting: Family Medicine

## 2019-05-15 NOTE — Telephone Encounter (Signed)
Electronic refill request. Carisoprodol Last office visit:   05/24/2018 Last Filled:    30 tablet 1 12/15/2018   Please advise.

## 2019-05-16 ENCOUNTER — Other Ambulatory Visit: Payer: Self-pay | Admitting: Family Medicine

## 2019-05-16 NOTE — Telephone Encounter (Signed)
Sent. Thanks.   

## 2019-05-16 NOTE — Telephone Encounter (Signed)
Electronic refill request. Tramadol Last office visit:   05/24/2018 Last Filled:    40 tablet 2 02/01/2019   Please advise.

## 2019-05-17 NOTE — Telephone Encounter (Signed)
Sent. Thanks.   

## 2019-05-25 ENCOUNTER — Other Ambulatory Visit: Payer: Self-pay | Admitting: Family Medicine

## 2019-05-25 DIAGNOSIS — I10 Essential (primary) hypertension: Secondary | ICD-10-CM

## 2019-05-26 ENCOUNTER — Other Ambulatory Visit (INDEPENDENT_AMBULATORY_CARE_PROVIDER_SITE_OTHER): Payer: BC Managed Care – PPO

## 2019-05-26 DIAGNOSIS — I10 Essential (primary) hypertension: Secondary | ICD-10-CM

## 2019-05-26 NOTE — Addendum Note (Signed)
Addended by: Ellamae Sia on: 05/26/2019 07:41 AM   Modules accepted: Orders

## 2019-05-27 LAB — COMPREHENSIVE METABOLIC PANEL
ALT: 18 IU/L (ref 0–44)
AST: 20 IU/L (ref 0–40)
Albumin/Globulin Ratio: 1.8 (ref 1.2–2.2)
Albumin: 4.2 g/dL (ref 3.8–4.8)
Alkaline Phosphatase: 77 IU/L (ref 39–117)
BUN/Creatinine Ratio: 27 — ABNORMAL HIGH (ref 10–24)
BUN: 24 mg/dL (ref 8–27)
Bilirubin Total: 0.4 mg/dL (ref 0.0–1.2)
CO2: 26 mmol/L (ref 20–29)
Calcium: 9 mg/dL (ref 8.6–10.2)
Chloride: 103 mmol/L (ref 96–106)
Creatinine, Ser: 0.88 mg/dL (ref 0.76–1.27)
GFR calc Af Amer: 106 mL/min/{1.73_m2} (ref >59)
GFR calc non Af Amer: 92 mL/min/{1.73_m2} (ref >59)
Globulin, Total: 2.3 g/dL (ref 1.5–4.5)
Glucose: 93 mg/dL (ref 65–99)
Potassium: 4.7 mmol/L (ref 3.5–5.2)
Sodium: 140 mmol/L (ref 134–144)
Total Protein: 6.5 g/dL (ref 6.0–8.5)

## 2019-05-27 LAB — LIPID PANEL
Chol/HDL Ratio: 5.6 ratio — ABNORMAL HIGH (ref 0.0–5.0)
Cholesterol, Total: 180 mg/dL (ref 100–199)
HDL: 32 mg/dL — ABNORMAL LOW (ref >39)
LDL Chol Calc (NIH): 119 mg/dL — ABNORMAL HIGH (ref 0–99)
Triglycerides: 161 mg/dL — ABNORMAL HIGH (ref 0–149)
VLDL Cholesterol Cal: 29 mg/dL (ref 5–40)

## 2019-06-02 ENCOUNTER — Encounter: Payer: BLUE CROSS/BLUE SHIELD | Admitting: Family Medicine

## 2019-06-07 ENCOUNTER — Other Ambulatory Visit: Payer: Self-pay | Admitting: Family Medicine

## 2019-06-08 ENCOUNTER — Ambulatory Visit (INDEPENDENT_AMBULATORY_CARE_PROVIDER_SITE_OTHER): Payer: BC Managed Care – PPO | Admitting: Family Medicine

## 2019-06-08 ENCOUNTER — Other Ambulatory Visit: Payer: Self-pay

## 2019-06-08 ENCOUNTER — Encounter: Payer: Self-pay | Admitting: Family Medicine

## 2019-06-08 VITALS — BP 140/86 | HR 91 | Temp 97.2°F | Ht 69.0 in | Wt 248.2 lb

## 2019-06-08 DIAGNOSIS — E785 Hyperlipidemia, unspecified: Secondary | ICD-10-CM

## 2019-06-08 DIAGNOSIS — M25569 Pain in unspecified knee: Secondary | ICD-10-CM

## 2019-06-08 DIAGNOSIS — Z0000 Encounter for general adult medical examination without abnormal findings: Secondary | ICD-10-CM

## 2019-06-08 DIAGNOSIS — Z7189 Other specified counseling: Secondary | ICD-10-CM

## 2019-06-08 DIAGNOSIS — R0981 Nasal congestion: Secondary | ICD-10-CM

## 2019-06-08 DIAGNOSIS — I10 Essential (primary) hypertension: Secondary | ICD-10-CM

## 2019-06-08 MED ORDER — ATORVASTATIN CALCIUM 10 MG PO TABS: 10.0000 mg | ORAL_TABLET | Freq: Every day | ORAL | 3 refills | Status: DC

## 2019-06-08 NOTE — Patient Instructions (Addendum)
Check with your insurance to see if they will cover the shingles shot.  Add on lipitor 10mg  a day.  If you have muscle aches, then stop the med an update me as needed.   Recheck labs in about 2 months at a fasting lab visit.  Take care.  Glad to see you.

## 2019-06-08 NOTE — Progress Notes (Signed)
This visit occurred during the SARS-CoV-2 public health emergency.  Safety protocols were in place, including screening questions prior to the visit, additional usage of staff PPE, and extensive cleaning of exam room while observing appropriate contact time as indicated for disinfecting solutions.  CPE- See plan.  Routine anticipatory guidance given to patient.  See health maintenance.  The possibility exists that previously documented standard health maintenance information may have been brought forward from a previous encounter into this note.  If needed, that same information has been updated to reflect the current situation based on today's encounter.    Tetanus 2017 PNA 2012  Flu yearly  Shingles shot d/w pt.  See avs.   Prostate cancer screening and PSA options (with potential risks and benefits of testing vs not testing) were discussed along with recent recs/guidelines.  He declined testing PSA at this point.  Colonoscopy 2019 Advance directive d/w pt. Would have his wife designated if patient were incapacitated. HCV prev neg. dw pt.  HIV prev neg. D/w pt.  Lung cancer screening d/w pt. he should get a call about follow-up with it.  Stressors d/w pt.  Both parents died in the last year, condolences offered.  covid stressors d/w pt.  His brother called today about settling the estate.   He is trying to adjust all of the changes.  Joint pain d/w pt.  Still using ibuprofen and tramadol at baseline.  Taking 1-2 iburofen a day with food, tramadol about once a day.  nsaids cautions d/w pt. tramadol caution discussed with patient.  No adverse effect on medication.  He does have relief with meds.  Hypertension: Using medication without problems or lightheadedness: yes Chest pain with exertion:no Edema:no Short of breath:no Discussed ascvd score and rationale for statin use.   Nasal congestion d/w pt. nasonex helps.  D/w pt about possible ENT eval if needed.  He will update me as  needed.  PMH and SH reviewed  Meds, vitals, and allergies reviewed.   ROS: Per HPI.  Unless specifically indicated otherwise in HPI, the patient denies:  General: fever. Eyes: acute vision changes ENT: sore throat Cardiovascular: chest pain Respiratory: SOB GI: vomiting GU: dysuria Musculoskeletal: acute back pain Derm: acute rash Neuro: acute motor dysfunction Psych: worsening mood Endocrine: polydipsia Heme: bleeding Allergy: hayfever  GEN: nad, alert and oriented HEENT: ncat NECK: supple w/o LA CV: rrr. PULM: ctab, no inc wob ABD: soft, +bs EXT: no edema SKIN: no acute rash  The 10-year ASCVD risk score Mikey Bussing DC Jr., et al., 2013) is: 16.7%   Values used to calculate the score:     Age: 64 years     Sex: Male     Is Non-Hispanic African American: No     Diabetic: No     Tobacco smoker: No     Systolic Blood Pressure: [id number] mmHg     Is BP treated: Yes     HDL Cholesterol: 32 mg/dL     Total Cholesterol: 180 mg/dL

## 2019-06-11 ENCOUNTER — Other Ambulatory Visit: Payer: Self-pay | Admitting: Family Medicine

## 2019-06-11 DIAGNOSIS — E785 Hyperlipidemia, unspecified: Secondary | ICD-10-CM | POA: Insufficient documentation

## 2019-06-11 DIAGNOSIS — R0981 Nasal congestion: Secondary | ICD-10-CM | POA: Insufficient documentation

## 2019-06-11 NOTE — Assessment & Plan Note (Signed)
Advance directive d/w pt. Would have his wife designated if patient were incapacitated. ?

## 2019-06-11 NOTE — Assessment & Plan Note (Signed)
Discussed ascvd score and rationale for statin use.  Start statin with routine cautions.  Recheck blood pressure reasonable.  Continue work on diet and exercise.  See after visit summary.

## 2019-06-11 NOTE — Assessment & Plan Note (Signed)
Discussed ascvd score and rationale for statin use.  Start statin with routine cautions.  Recheck blood pressure reasonable.  Continue work on diet and exercise.

## 2019-06-11 NOTE — Assessment & Plan Note (Signed)
Nasal congestion d/w pt. nasonex helps.  D/w pt about possible ENT eval if needed.  He will update me as needed.

## 2019-06-11 NOTE — Assessment & Plan Note (Signed)
  Tetanus 2017 PNA 2012  Flu yearly  Shingles shot d/w pt.  See avs.   Prostate cancer screening and PSA options (with potential risks and benefits of testing vs not testing) were discussed along with recent recs/guidelines.  He declined testing PSA at this point.  Colonoscopy 2019 Advance directive d/w pt. Would have his wife designated if patient were incapacitated. HCV prev neg. dw pt.  HIV prev neg. D/w pt.  Lung cancer screening d/w pt. he should get a call about follow-up with it. Covid vaccination discussed with patient.  Stressors d/w pt.  Both parents died in the last year, condolences offered.  covid stressors d/w pt.  His brother called today about settling the estate.   He is trying to adjust all of the changes.

## 2019-06-11 NOTE — Assessment & Plan Note (Signed)
  Joint pain d/w pt.  Still using ibuprofen and tramadol at baseline.  Taking 1-2 iburofen a day with food, tramadol about once a day.  nsaids cautions d/w pt. tramadol caution discussed with patient.  No adverse effect on medication.  He does have relief with meds.  Continue as is.  He agrees.

## 2019-06-19 ENCOUNTER — Telehealth: Payer: Self-pay | Admitting: *Deleted

## 2019-06-19 NOTE — Telephone Encounter (Signed)
Patient contacted in attempt to schedule lung screening scan. Patient reports he has some things to take care of and that he will call me back when he is ready to schedule.

## 2019-06-23 ENCOUNTER — Other Ambulatory Visit: Payer: Self-pay | Admitting: Family Medicine

## 2019-07-29 ENCOUNTER — Other Ambulatory Visit: Payer: Self-pay | Admitting: Family Medicine

## 2019-07-31 NOTE — Telephone Encounter (Signed)
Electronic refill request. Carisoprodol Last office visit:   06/08/2019 Last Filled:  30 tablet 1 05/16/2019  Please advise.

## 2019-08-01 NOTE — Telephone Encounter (Signed)
Sent. Thanks.   

## 2019-08-04 ENCOUNTER — Telehealth: Payer: Self-pay | Admitting: Family Medicine

## 2019-08-04 NOTE — Telephone Encounter (Signed)
Pt has fasting lab appointment for chol on 3/31.  Pt stated his statin caused joints in right hand to hurt.  He took meds a couple weeks. Then stop taking meds and  Pain stopped.  He restated meds and pain started again.  He stopped med 2-3 weeks ago.  His hand is still a little sore but better since he stop meds.  Does pt need keep appointment for labs and what would you recommend pt to take  [facility] [address]

## 2019-08-06 NOTE — Telephone Encounter (Addendum)
Please call patient.  He did the right thing by stopping and then retrying the medication.  I appreciate his effort and his attention to detail.    I would push the lab visit back for now.  See if he is willing to try pravastatin 10 mg a day.  He may be able to tolerate that without the aches.  It is not as potent as Lipitor but it would be better than nothing.  I would stay off the Lipitor.  I added to his intolerance list.  Please let me know if he is willing to try pravastatin (and please tell him I think it is worthwhile to try).  If so, and if he can tolerate it, then I would get a fasting lab appointment in about 2 months.  Thanks.

## 2019-08-07 NOTE — Telephone Encounter (Signed)
Patient advised.  Lab appt scheduled.   Please send in Pravastatin to his pharmacy.

## 2019-08-08 MED ORDER — PRAVASTATIN SODIUM 10 MG PO TABS: 10.0000 mg | ORAL_TABLET | Freq: Every day | ORAL | 3 refills | Status: DC

## 2019-08-08 NOTE — Telephone Encounter (Signed)
Sent. Thanks.   

## 2019-08-09 ENCOUNTER — Other Ambulatory Visit: Payer: BC Managed Care – PPO

## 2019-08-25 ENCOUNTER — Other Ambulatory Visit: Payer: Self-pay | Admitting: Family Medicine

## 2019-08-25 ENCOUNTER — Telehealth: Payer: Self-pay

## 2019-08-25 NOTE — Telephone Encounter (Signed)
Message left notifying patient that it is time to schedule the low dose lung cancer screening CT scan.  Instructed patient to return call to Shawn Perkins at [phone] to verify information prior to CT scan being scheduled.    

## 2019-08-26 NOTE — Telephone Encounter (Signed)
Last filled 07-25-19 #40 Last OV 06-08-19 No Future OV [facility]

## 2019-08-27 NOTE — Telephone Encounter (Signed)
Sent. Thanks.   

## 2019-09-05 ENCOUNTER — Other Ambulatory Visit: Payer: Self-pay | Admitting: Family Medicine

## 2019-09-05 NOTE — Telephone Encounter (Signed)
Electronic refill request. Benzonatate Last office visit:   06/08/2019 Last Filled:    30 capsule 2 02/07/2018  Please advise.

## 2019-09-07 NOTE — Telephone Encounter (Signed)
Sent. Thanks.  If persistent cough then please have patient call or get transferred to triage line.

## 2019-09-07 NOTE — Telephone Encounter (Signed)
Patient advised.

## 2019-09-11 ENCOUNTER — Telehealth: Payer: Self-pay

## 2019-09-11 DIAGNOSIS — Z122 Encounter for screening for malignant neoplasm of respiratory organs: Secondary | ICD-10-CM

## 2019-09-11 DIAGNOSIS — Z87891 Personal history of nicotine dependence: Secondary | ICD-10-CM

## 2019-09-11 NOTE — Telephone Encounter (Signed)
Message left notifying patient that it is time to schedule the low dose lung cancer screening CT scan.  Instructed patient to return call to Shawn Perkins at [phone] to verify information prior to CT scan being scheduled.    

## 2019-09-19 ENCOUNTER — Other Ambulatory Visit: Payer: Self-pay | Admitting: Family Medicine

## 2019-09-19 NOTE — Telephone Encounter (Signed)
Electronic refill request. IBP Last office visit:   06/08/2019 Last Filled:     90 tablet 1 05/09/2019  Please advise.

## 2019-09-20 NOTE — Telephone Encounter (Signed)
Sent. Thanks.   

## 2019-10-04 ENCOUNTER — Other Ambulatory Visit (INDEPENDENT_AMBULATORY_CARE_PROVIDER_SITE_OTHER): Payer: Self-pay

## 2019-10-04 DIAGNOSIS — E785 Hyperlipidemia, unspecified: Secondary | ICD-10-CM

## 2019-10-04 NOTE — Addendum Note (Signed)
Addended by: Ellamae Sia on: 10/04/2019 07:44 AM   Modules accepted: Orders

## 2019-10-05 LAB — LIPID PANEL
Chol/HDL Ratio: 5.4 ratio — ABNORMAL HIGH (ref 0.0–5.0)
Cholesterol, Total: 177 mg/dL (ref 100–199)
HDL: 33 mg/dL — ABNORMAL LOW (ref >39)
LDL Chol Calc (NIH): 120 mg/dL — ABNORMAL HIGH (ref 0–99)
Triglycerides: 130 mg/dL (ref 0–149)
VLDL Cholesterol Cal: 24 mg/dL (ref 5–40)

## 2019-10-05 LAB — HEPATIC FUNCTION PANEL
ALT: 24 IU/L (ref 0–44)
AST: 32 IU/L (ref 0–40)
Albumin: 4.2 g/dL (ref 3.8–4.8)
Alkaline Phosphatase: 68 IU/L (ref 48–121)
Bilirubin Total: 0.4 mg/dL (ref 0.0–1.2)
Bilirubin, Direct: 0.08 mg/dL (ref 0.00–0.40)
Total Protein: 6.9 g/dL (ref 6.0–8.5)

## 2019-12-03 ENCOUNTER — Other Ambulatory Visit: Payer: Self-pay | Admitting: Family Medicine

## 2019-12-04 NOTE — Telephone Encounter (Signed)
Electronic refill request. Tramadol Last office visit:   06/08/2019 Last Filled:    40 tablet 2 08/27/2019  Please advise.

## 2019-12-05 NOTE — Telephone Encounter (Signed)
Sent. Thanks.   

## 2019-12-08 ENCOUNTER — Telehealth: Payer: Self-pay | Admitting: *Deleted

## 2019-12-08 ENCOUNTER — Encounter: Payer: Self-pay | Admitting: *Deleted

## 2019-12-08 NOTE — Telephone Encounter (Signed)
PA submitted thru [facility] for Tramadol, awaiting response.

## 2019-12-28 NOTE — Addendum Note (Signed)
Addended by: Lieutenant Diego on: 12/28/2019 10:53 AM   Modules accepted: Orders

## 2019-12-28 NOTE — Telephone Encounter (Signed)
Contacted and scheduled 

## 2020-01-02 ENCOUNTER — Other Ambulatory Visit: Payer: Self-pay

## 2020-01-02 ENCOUNTER — Ambulatory Visit
Admission: RE | Admit: 2020-01-02 | Discharge: 2020-01-02 | Disposition: A | Discharge status: Home / Self Care | Payer: 59 | Source: Ambulatory Visit | Attending: Oncology | Admitting: Oncology

## 2020-01-02 ENCOUNTER — Other Ambulatory Visit: Payer: Self-pay | Admitting: Family Medicine

## 2020-01-02 DIAGNOSIS — Z122 Encounter for screening for malignant neoplasm of respiratory organs: Secondary | ICD-10-CM | POA: Diagnosis present

## 2020-01-02 DIAGNOSIS — Z87891 Personal history of nicotine dependence: Secondary | ICD-10-CM | POA: Insufficient documentation

## 2020-01-02 NOTE — Telephone Encounter (Signed)
Electronic refill request. IBP 800 mg Last office visit:   06/08/2019 Last Filled:    90 tablet 1 09/20/2019

## 2020-01-03 ENCOUNTER — Telehealth: Payer: Self-pay | Admitting: Family Medicine

## 2020-01-03 NOTE — Telephone Encounter (Signed)
Sent. Thanks.   

## 2020-01-03 NOTE — Telephone Encounter (Signed)
Please update patient Lung screening is negative, no suspicious lesions.  This is good news.  Continue annual screening with low-dose chest CT without contrast in 12 months.  He should get a phone call about that next year. 2. Hepatic steatosis. 3. Aortic Atherosclerosis (ICD10-I70.0) and Emphysema (ICD10-J43.9).  Please make sure you still taking/tolerate pravastatin.  Goal to avoid extra weight gain.  Thanks.

## 2020-01-05 NOTE — Telephone Encounter (Signed)
Patient advised.  Patient was not able to tolerate the Pravastatin because of joint pain.

## 2020-01-05 NOTE — Addendum Note (Signed)
Addended by: Tonia Ghent on: 01/05/2020 10:40 AM   Modules accepted: Orders

## 2020-01-05 NOTE — Telephone Encounter (Signed)
Duly noted.  Med list updated.  Thanks.

## 2020-01-06 ENCOUNTER — Encounter: Payer: Self-pay | Admitting: *Deleted

## 2020-01-08 ENCOUNTER — Other Ambulatory Visit: Payer: Self-pay | Admitting: Family Medicine

## 2020-01-09 NOTE — Telephone Encounter (Signed)
Electronic refill request. Carisoprodol Last office visit:   06/08/2019 Last Filled:    30 tablet 2 08/01/2019

## 2020-01-10 NOTE — Telephone Encounter (Signed)
Sent. Thanks.   

## 2020-01-12 ENCOUNTER — Other Ambulatory Visit: Payer: Self-pay

## 2020-01-12 ENCOUNTER — Ambulatory Visit: Payer: 59 | Admitting: Family Medicine

## 2020-01-12 ENCOUNTER — Encounter: Payer: Self-pay | Admitting: Family Medicine

## 2020-01-12 VITALS — BP 142/72 | HR 91 | Temp 97.2°F | Ht 69.0 in | Wt 264.4 lb

## 2020-01-12 DIAGNOSIS — L089 Local infection of the skin and subcutaneous tissue, unspecified: Secondary | ICD-10-CM | POA: Diagnosis not present

## 2020-01-12 DIAGNOSIS — L723 Sebaceous cyst: Secondary | ICD-10-CM | POA: Diagnosis not present

## 2020-01-12 NOTE — Progress Notes (Signed)
This visit occurred during the SARS-CoV-2 public health emergency.  Safety protocols were in place, including screening questions prior to the visit, additional usage of staff PPE, and extensive cleaning of exam room while observing appropriate contact time as indicated for disinfecting solutions.  Irritated lesion on the R side of the neck.  It had prev drained a little.  Gradually enlarging.  No FCNAVD.   I&D  Meds, vitals, and allergies reviewed.   Indication: infected seb cyst.   Pt complaints of: erythema, pain, swelling  Location: Right side of the neck  Size: 3 cm  Informed consent obtained.  Pt aware of risks not limited to but including infection, bleeding, damage to near by organs.  Prep: etoh/betadine  Anesthesia: 1%lidocaine with epi, good effect  Incision made with #11 blade  Wound explored and loculations removed  Wound packed with iodoform gauze

## 2020-01-12 NOTE — Patient Instructions (Signed)
Pull the packing Sunday AM.  Keep it covered in the meantime and as needed.  Wash with soapy water after packing removed.  Update me as needed.  Take care.  Glad to see you.

## 2020-01-15 DIAGNOSIS — L089 Local infection of the skin and subcutaneous tissue, unspecified: Secondary | ICD-10-CM | POA: Insufficient documentation

## 2020-01-15 NOTE — Assessment & Plan Note (Signed)
I&D successfully done Tolerated well.  No complications. Routine postprocedure instructions d/w pt- remove packing in ~48h, keep area clean and bandaged, follow up if concerns/spreading erythema/pain.  He agrees with plan.

## 2020-01-25 ENCOUNTER — Encounter: Payer: Self-pay | Admitting: Family Medicine

## 2020-04-06 ENCOUNTER — Other Ambulatory Visit: Payer: Self-pay | Admitting: Family Medicine

## 2020-05-14 ENCOUNTER — Other Ambulatory Visit: Payer: Self-pay | Admitting: Family Medicine

## 2020-05-14 NOTE — Telephone Encounter (Signed)
Please Advise

## 2020-05-15 NOTE — Telephone Encounter (Signed)
Prescription sent.  Please schedule patient for yearly visit this spring, when possible.  Thanks.

## 2020-06-27 ENCOUNTER — Other Ambulatory Visit: Payer: Self-pay | Admitting: Family Medicine

## 2020-07-07 ENCOUNTER — Other Ambulatory Visit: Payer: Self-pay | Admitting: Family Medicine

## 2020-07-07 DIAGNOSIS — I10 Essential (primary) hypertension: Secondary | ICD-10-CM

## 2020-07-11 ENCOUNTER — Other Ambulatory Visit: Payer: Self-pay | Admitting: Family Medicine

## 2020-07-11 NOTE — Telephone Encounter (Signed)
Refill request for Soma 350 mg tablets  LOV - 01/12/20 Next OV - 07/26/20 Last refilled - 01/10/20 #30/2

## 2020-07-12 NOTE — Telephone Encounter (Signed)
Sent. Thanks.   

## 2020-07-14 ENCOUNTER — Other Ambulatory Visit: Payer: Self-pay | Admitting: Family Medicine

## 2020-07-15 NOTE — Telephone Encounter (Signed)
Pharmacy requests refill on: Ibuprofen 800 mg   LAST REFILL: 04/08/2020 (Q-90, R-1) LAST OV: 01/12/2020 NEXT OV: 07/26/2020 PHARMACY: [id number] [place], [place]

## 2020-07-19 ENCOUNTER — Other Ambulatory Visit (INDEPENDENT_AMBULATORY_CARE_PROVIDER_SITE_OTHER): Payer: 59

## 2020-07-19 ENCOUNTER — Other Ambulatory Visit: Payer: Self-pay

## 2020-07-19 DIAGNOSIS — I10 Essential (primary) hypertension: Secondary | ICD-10-CM | POA: Diagnosis not present

## 2020-07-19 NOTE — Addendum Note (Signed)
Addended by: Cloyd Stagers on: 07/19/2020 07:49 AM   Modules accepted: Orders

## 2020-07-20 LAB — COMPREHENSIVE METABOLIC PANEL
ALT: 25 IU/L (ref 0–44)
AST: 23 IU/L (ref 0–40)
Albumin/Globulin Ratio: 1.5 (ref 1.2–2.2)
Albumin: 4 g/dL (ref 3.8–4.8)
Alkaline Phosphatase: 74 IU/L (ref 44–121)
BUN/Creatinine Ratio: 28 — ABNORMAL HIGH (ref 10–24)
BUN: 24 mg/dL (ref 8–27)
Bilirubin Total: 0.3 mg/dL (ref 0.0–1.2)
CO2: 24 mmol/L (ref 20–29)
Calcium: 9 mg/dL (ref 8.6–10.2)
Chloride: 106 mmol/L (ref 96–106)
Creatinine, Ser: 0.86 mg/dL (ref 0.76–1.27)
Globulin, Total: 2.6 g/dL (ref 1.5–4.5)
Glucose: 93 mg/dL (ref 65–99)
Potassium: 4.8 mmol/L (ref 3.5–5.2)
Sodium: 140 mmol/L (ref 134–144)
Total Protein: 6.6 g/dL (ref 6.0–8.5)
eGFR: 97 mL/min/{1.73_m2} (ref >59)

## 2020-07-20 LAB — LIPID PANEL
Chol/HDL Ratio: 5.7 ratio — ABNORMAL HIGH (ref 0.0–5.0)
Cholesterol, Total: 187 mg/dL (ref 100–199)
HDL: 33 mg/dL — ABNORMAL LOW (ref >39)
LDL Chol Calc (NIH): 129 mg/dL — ABNORMAL HIGH (ref 0–99)
Triglycerides: 138 mg/dL (ref 0–149)
VLDL Cholesterol Cal: 25 mg/dL (ref 5–40)

## 2020-07-26 ENCOUNTER — Encounter: Payer: Self-pay | Admitting: Family Medicine

## 2020-07-26 ENCOUNTER — Ambulatory Visit (INDEPENDENT_AMBULATORY_CARE_PROVIDER_SITE_OTHER): Payer: 59 | Admitting: Family Medicine

## 2020-07-26 ENCOUNTER — Other Ambulatory Visit: Payer: Self-pay

## 2020-07-26 VITALS — BP 130/92 | HR 81 | Temp 97.1°F | Ht 69.0 in | Wt 256.0 lb

## 2020-07-26 DIAGNOSIS — Z0000 Encounter for general adult medical examination without abnormal findings: Secondary | ICD-10-CM | POA: Diagnosis not present

## 2020-07-26 DIAGNOSIS — Z23 Encounter for immunization: Secondary | ICD-10-CM

## 2020-07-26 DIAGNOSIS — E785 Hyperlipidemia, unspecified: Secondary | ICD-10-CM

## 2020-07-26 DIAGNOSIS — Z7189 Other specified counseling: Secondary | ICD-10-CM

## 2020-07-26 DIAGNOSIS — M25569 Pain in unspecified knee: Secondary | ICD-10-CM

## 2020-07-26 NOTE — Patient Instructions (Addendum)
Keep working on your [employer] and I'll work on the lipid clinic to see about options (PSK 9 inhibitors). Take care.  Glad to see you.  Try using a thumb spica brace splint.  Update me if that doesn't help.  You can wear it at night.   2nd shingles shot no sooner than 2 months from today.

## 2020-07-26 NOTE — Progress Notes (Signed)
This visit occurred during the SARS-CoV-2 public health emergency.  Safety protocols were in place, including screening questions prior to the visit, additional usage of staff PPE, and extensive cleaning of exam room while observing appropriate contact time as indicated for disinfecting solutions.  CPE- See plan.  Routine anticipatory guidance given to patient.  See health maintenance.  The possibility exists that previously documented standard health maintenance information may have been brought forward from a previous encounter into this note.  If needed, that same information has been updated to reflect the current situation based on today's encounter.    Tetanus 2017 PNA 2012  Flu yearly  Shingrix 2022.  covid up to date.   Prostate cancer screening and PSA options(with potential risks and benefits of testing vs not testing) were discussed along with recent recs/guidelines. He declined testing PSAat this point.  Colonoscopy 2019 Advance directive d/w pt. Would have his wife designated if patient were incapacitated. HCV prev neg.  HIV prev neg.  Lung cancer screening d/w pt. he should get a call about follow-up with it this fall.    Intentional weight loss noted.    Hypertension:   Using medication without problems or lightheadedness: yes Chest pain with exertion:no Edema:no Short of breath:no Statin intolerant, d/w pt.  We talked about possible lipid clinic referral.    Joint pain/thumb pain.  R thumb pain, along the side of the thumb that apposes the 2nd finger.  D/w pt about trial of spica brace.  I asked him to update me as needed.  PMH and SH reviewed  Meds, vitals, and allergies reviewed.   ROS: Per HPI.  Unless specifically indicated otherwise in HPI, the patient denies:  General: fever. Eyes: acute vision changes ENT: sore throat Cardiovascular: chest pain Respiratory: SOB GI: vomiting GU: dysuria Musculoskeletal: acute back pain Derm: acute rash Neuro: acute  motor dysfunction Psych: worsening mood Endocrine: polydipsia Heme: bleeding Allergy: hayfever  GEN: nad, alert and oriented HEENT: ncat NECK: supple w/o LA CV: rrr. PULM: ctab, no inc wob ABD: soft, +bs EXT: no edema SKIN: no acute rash

## 2020-07-28 NOTE — Assessment & Plan Note (Signed)
Advance directive d/w pt. Would have his wife designated if patient were incapacitated. ?

## 2020-07-28 NOTE — Assessment & Plan Note (Signed)
Refer to lipid clinic.  Rationale discussed with patient.  He agrees.

## 2020-07-28 NOTE — Assessment & Plan Note (Signed)
Tetanus 2017 PNA 2012  Flu yearly  Shingrix 2022.  covid up to date.   Prostate cancer screening and PSA options(with potential risks and benefits of testing vs not testing) were discussed along with recent recs/guidelines. He declined testing PSAat this point.  Colonoscopy 2019 Advance directive d/w pt. Would have his wife designated if patient were incapacitated. HCV prev neg.  HIV prev neg.  Lung cancer screening d/w pt. he should get a call about follow-up with it this fall.    Intentional weight loss noted.

## 2020-07-28 NOTE — Assessment & Plan Note (Signed)
Would continue baseline tramadol and Soma.  Used as needed.  No adverse effect on medications.  Sedation caution given on medications.  He is also having thumb pain I asked him to try spica splint and update me if that is not helping.  He agrees with plan.

## 2020-08-01 ENCOUNTER — Ambulatory Visit (INDEPENDENT_AMBULATORY_CARE_PROVIDER_SITE_OTHER): Payer: 59 | Admitting: Internal Medicine

## 2020-08-01 ENCOUNTER — Encounter: Payer: Self-pay | Admitting: Internal Medicine

## 2020-08-01 ENCOUNTER — Other Ambulatory Visit: Payer: Self-pay

## 2020-08-01 VITALS — BP 140/72 | HR 77 | Ht 68.0 in | Wt 260.0 lb

## 2020-08-01 DIAGNOSIS — E782 Mixed hyperlipidemia: Secondary | ICD-10-CM | POA: Diagnosis not present

## 2020-08-01 DIAGNOSIS — I700 Atherosclerosis of aorta: Secondary | ICD-10-CM | POA: Diagnosis not present

## 2020-08-01 MED ORDER — ROSUVASTATIN CALCIUM 5 MG PO TABS: 5.0000 mg | ORAL_TABLET | Freq: Every day | ORAL | 3 refills | Status: DC

## 2020-08-01 NOTE — Progress Notes (Signed)
LIPID CLINIC CONSULT NOTE  Chief Complaint:  Manage dyslipidemia  Primary Care Physician: Juan Ghent, MD  Primary Cardiologist:  No primary care provider on file.  HPI:  Juan Thompson is a 65 y.o. male who is being seen today for the evaluation of dyslipidemia at the request of Juan Thompson, Juan Rising, MD.  This is a pleasant 64 year old male kindly referred by Dr. Damita Thompson for evaluation management of dyslipidemia.  Juan Thompson had 2 screening CT exams due to history of smoking although he quit a number of years ago.  This demonstrated aortic atherosclerosis" branch vessel disease".  No specific mention of coronary artery calcification.  He does have a family history of heart disease including his father who had high cholesterol and developed coronary disease in his 76s and received a stent.  Other risk factors include hypertension on lisinopril, OSA (intolerant to CPAP) and obesity.  He tried to start medications, namely amlodipine 10 mg daily and pravastatin 20 mg daily both of which caused significant achiness and joint pain particularly in his hands.  He does have a history of some arthritis there.  He notes that it did improve however after stopping it almost completely.  He reports a varied diet but tries to avoid saturated fats.  He is unable to do a lot of exercise due to arthritis.  PMHx:  Past Medical History:  Diagnosis Date  . Arthritis    L knee, occ vicodin use  . ED (erectile dysfunction)   . History of MRI of cervical spine 04/17/05   without DDD 4/5, bulg 5/6, narrowing 5/6  . Hypercholesteremia    mild  . Hypertension   . MRSA bacteremia    lower extremity- age 78  . Neck pain    with soma use for spasm, he sleeps with round pillow under neck for comfort  . Sleep apnea 04/17/05   mild, couldn't tolerate CPAP.     Past Surgical History:  Procedure Laterality Date  . therapeutic nasal fracture  1976  . TONSILLECTOMY      As a child  . WISDOM TOOTH EXTRACTION       FAMHx:  Family History  Problem Relation Age of Onset  . Hypertension Mother   . Heart disease Mother   . Thyroid disease Mother   . Heart disease Father        CAD stent  . Cancer Father        skin cancer  . Brain cancer Father   . Hypertension Brother   . Diabetes Other   . Parkinsonism Paternal Grandmother   . Colon cancer Neg Hx   . Prostate cancer Neg Hx     SOCHx:   reports that he quit smoking about 11 years ago. He has a 41.25 pack-year smoking history. He has never used smokeless tobacco. He reports current alcohol use. He reports that he does not use drugs.  ALLERGIES:  Allergies  Allergen Reactions  . Amlodipine Other (See Comments)    Edema/joint aches  . Codeine     REACTION: dizziness  . Flexeril [Cyclobenzaprine] Other (See Comments)    sedation  . Lipitor [Atorvastatin]     aches  . Pravastatin     Joint pain.  . Tizanidine     sedation    ROS: Pertinent items noted in HPI and remainder of comprehensive ROS otherwise negative.  HOME MEDS: Current Outpatient Medications on File Prior to Visit  Medication Sig Dispense Refill  . benzonatate (  TESSALON) 200 MG capsule TAKE 1 CAPSULE BY MOUTH THREE TIMES A DAY AS NEEDED 30 capsule 2  . carisoprodol (SOMA) 350 MG tablet TAKE 1 TABLET BY MOUTH FOUR TIMES A DAY AS NEEDED 30 tablet 2  . ibuprofen (ADVIL) 800 MG tablet TAKE ONE TABLET BY MOUTH EVERY 8 HOURS AS NEEDED FOR PAIN WITH FOOD 90 tablet 0  . lisinopril (ZESTRIL) 20 MG tablet TAKE 1 TABLET BY MOUTH EVERY DAY 90 tablet 2  . Multiple Vitamins-Minerals (ADULT GUMMY PO) Take by mouth. Take by mouth 4 times a week    . traMADol (ULTRAM) 50 MG tablet TAKE 1 TABLET BY MOUTH EVERY 6 HOURS AS NEEDED 40 tablet 2  . triamcinolone (NASACORT) 55 MCG/ACT AERO nasal inhaler Place 2 sprays into the nose daily.     No current facility-administered medications on file prior to visit.    LABS/IMAGING: No results found for this or any previous visit (from the  past 48 hour(s)). No results found.  LIPID PANEL:    Component Value Date/Time   CHOL 187 07/19/2020 0749   CHOL 200 01/15/2017 0000   CHOL 200 01/15/2017 0000   TRIG 138 07/19/2020 0749   TRIG 138 01/15/2017 0000   TRIG 138 01/15/2017 0000   HDL 33 (L) 07/19/2020 0749   HDL 33 01/15/2017 0000   HDL 33 01/15/2017 0000   CHOLHDL 5.7 (H) 07/19/2020 0749   CHOLHDL 6 07/01/2010 0912   VLDL 42.2 (H) 07/01/2010 0912   LDLCALC 129 (H) 07/19/2020 0749   LDLCALC 139 01/15/2017 0000   LDLCALC 139 01/15/2017 0000   LDLDIRECT 138.9 07/01/2010 0912    WEIGHTS: Wt Readings from Last 3 Encounters:  08/01/20 260 lb (117.9 kg)  07/26/20 256 lb (116.1 kg)  01/12/20 264 lb 7 oz (119.9 kg)    VITALS: BP 140/72   Pulse 77   Ht 5\' 8"  (1.727 m)   Wt 260 lb (117.9 kg)   SpO2 97%   BMI 39.53 kg/m   EXAM: General appearance: alert, no distress and morbidly obese Neck: no carotid bruit, no JVD and thyroid not enlarged, symmetric, no tenderness/mass/nodules Lungs: clear to auscultation bilaterally Heart: regular rate and rhythm Abdomen: soft, non-tender; bowel sounds normal; no masses,  no organomegaly and obese Extremities: extremities normal, atraumatic, no cyanosis or edema Pulses: 2+ and symmetric Skin: Skin color, texture, turgor normal. No rashes or lesions Neurologic: Grossly normal Psych: Pleasant  EKG: Deferred  ASSESSMENT: 1. Mixed dyslipidemia 2. 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 Family history of cardiovascular disease, not premature onset 4. Morbid obesity 5. OSA-intolerant of CPAP  PLAN: 1.   Juan Thompson has a mixed dyslipidemia but has not been tolerant of a atorvastatin or pravastatin.  I still think that these best option for cardiovascular risk reduction is statin therapy and probably he would benefit from a low-dose if he could tolerate it.  I would recommend we try low-dose rosuvastatin 5 mg daily.  At minimum I would target LDL  to less than 100 but perhaps even lower.  I think we could make some improvement with the diet and weight loss as well.  He is willing to try it as the side effects are typically completely reversible.  If this is not well-tolerated then I would consider ezetimibe as a second line option.  Unfortunately he does not have enough known cardiovascular disease to qualify for PCSK9 inhibitor at this point.  Thanks as always for the kind referral.  Pixie Casino, MD, [facility], [place] Director of the Advanced Lipid Disorders &  Cardiovascular Risk Reduction Clinic Diplomate of the American Board of Clinical Lipidology Attending Cardiologist  Direct Dial: [phone]  Fax: [phone]  Website:  [email or web address] 08/01/2020, 8:30 AM

## 2020-08-01 NOTE — Patient Instructions (Signed)
Medication Instructions:  Your physician has recommended you make the following change in your medication:  START rosuvastatin (crestor) 5mg  daily  *If you need a refill on your cardiac medications before your next appointment, please call your pharmacy*   Lab Work: FASTING lab work in 3 months to check cholesterol   If you have labs (blood work) drawn today and your tests are completely normal, you will receive your results only by: Marland Kitchen MyChart Message (if you have MyChart) OR . A paper copy in the mail If you have any lab test that is abnormal or we need to change your treatment, we will call you to review the results.   Testing/Procedures: NONE   Follow-Up: At [facility], you and your health needs are our priority.  As part of our continuing mission to provide you with exceptional heart care, we have created designated Provider Care Teams.  These Care Teams include your primary Cardiologist (physician) and Advanced Practice Providers (APPs -  Physician Assistants and Nurse Practitioners) who all work together to provide you with the care you need, when you need it.  We recommend signing up for the patient portal called "MyChart".  Sign up information is provided on this After Visit Summary.  MyChart is used to connect with patients for Virtual Visits (Telemedicine).  Patients are able to view lab/test results, encounter notes, upcoming appointments, etc.  Non-urgent messages can be sent to your provider as well.   To learn more about what you can do with MyChart, go to [email or web address].    Your next appointment:   3 month(s) - lipid clinic  The format for your next appointment:   In Person or Virtual  Provider:   K. Mali Hilty, MD   Other Instructions

## 2020-08-19 ENCOUNTER — Other Ambulatory Visit: Payer: Self-pay | Admitting: Family Medicine

## 2020-08-20 NOTE — Telephone Encounter (Signed)
Refill request for Tramadol 50 mg tablets   LOV - 07/26/20 Next OV - not scheduled Last refill - 05/15/20 #40/2

## 2020-08-21 NOTE — Telephone Encounter (Signed)
Sent. Thanks.   

## 2020-09-11 ENCOUNTER — Encounter: Payer: Self-pay | Admitting: Physician Assistant

## 2020-09-11 ENCOUNTER — Telehealth: Payer: 59 | Admitting: Physician Assistant

## 2020-09-11 DIAGNOSIS — R6889 Other general symptoms and signs: Secondary | ICD-10-CM | POA: Diagnosis not present

## 2020-09-11 MED ORDER — PROMETHAZINE-DM 6.25-15 MG/5ML PO SYRP: 5.0000 mL | ORAL_SOLUTION | Freq: Four times a day (QID) | ORAL | 0 refills | Status: DC | PRN

## 2020-09-11 MED ORDER — OSELTAMIVIR PHOSPHATE 75 MG PO CAPS: 75.0000 mg | ORAL_CAPSULE | Freq: Two times a day (BID) | ORAL | 0 refills | Status: DC

## 2020-09-11 NOTE — Progress Notes (Signed)
Juan Thompson, Juan Thompson are scheduled for a virtual visit with your provider today.    Just as we do with appointments in the office, we must obtain your consent to participate.  Your consent will be active for this visit and any virtual visit you may have with one of our providers in the next 365 days.    If you have a MyChart account, I can also send a copy of this consent to you electronically.  All virtual visits are billed to your insurance company just like a traditional visit in the office.  As this is a virtual visit, video technology does not allow for your provider to perform a traditional examination.  This may limit your provider's ability to fully assess your condition.  If your provider identifies any concerns that need to be evaluated in person or the need to arrange testing such as labs, EKG, etc, we will make arrangements to do so.    Although advances in technology are sophisticated, we cannot ensure that it will always work on either your end or our end.  If the connection with a video visit is poor, we may have to switch to a telephone visit.  With either a video or telephone visit, we are not always able to ensure that we have a secure connection.   I need to obtain your verbal consent now.   Are you willing to proceed with your visit today?   Juan Thompson has provided verbal consent on 09/11/2020 for a virtual visit (video or telephone).   Leeanne Rio, PA-C 09/11/2020  8:33 AM   Virtual Visit via Video   I connected with patient on 09/11/20 at  8:45 AM EDT by a video enabled telemedicine application and verified that I am speaking with the correct person using two identifiers.  Location patient: Home Location provider: [place] participating in the virtual visit: Patient, Provider  I discussed the limitations of evaluation and management by telemedicine and the availability of in person appointments. The patient expressed understanding and agreed  to proceed.  Subjective:   HPI:   Patient presents via [place] today complaining of abrupt onset of aches, chills, fatigue with nasal congestion and drainage starting on Monday.  Notes symptoms have continued since that point with coughing and chest congestion starting last night.  Notes cough is mainly dry but occasionally he will get up some phlegm which is a clear to gray color.  Does note some chest tenderness with repetitive coughing.  Denies any objective fever but has felt hot.  Denies any sinus pain, ear pain or tooth pain.  Denies true sore throat.  Denies recent travel or known sick contact.  Does state he did go into [employer] this past week without masking for the first time.  Has been taking some OTC Mucinex without substantial improvement in symptoms.  Has taken 2 COVID test since onset of symptoms which have been negative.  Is up-to-date on flu and COVID vaccines.  Is concerned about potential flulike illness.  Also wants prescription for Tussionex as he states this is what he typically gets from his primary care provider for cough..  ROS:   See pertinent positives and negatives per HPI.  Patient Active Problem List   Diagnosis Date Noted  . HLD (hyperlipidemia) 06/11/2019  . Nasal congestion 06/11/2019  . Personal history of tobacco use, presenting hazards to health 06/20/2018  . Advance care planning 01/21/2017  . Routine general medical examination at a  health care facility 08/27/2011  . Knee pain 08/27/2011  . 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 OBESITY 01/07/2009  . Muscle spasm 10/05/2007  . SLEEP APNEA, OBSTRUCTIVE, MILD 12/30/2006    Social History   Tobacco Use  . Smoking status: Former Smoker    Packs/day: 1.25    Years: 33.00    Pack years: 56.25    Quit date: 08/26/2008    Years since quitting: 12.0  . Smokeless tobacco: Never Used  . Tobacco comment: quit in ~ 2010 with Chantix  Substance Use Topics  . Alcohol use: Yes    Comment: occ on weekend, rare     Current Outpatient Medications:  .  benzonatate (TESSALON) 200 MG capsule, TAKE 1 CAPSULE BY MOUTH THREE TIMES A DAY AS NEEDED, Disp: 30 capsule, Rfl: 2 .  carisoprodol (SOMA) 350 MG tablet, TAKE 1 TABLET BY MOUTH FOUR TIMES A DAY AS NEEDED, Disp: 30 tablet, Rfl: 2 .  ibuprofen (ADVIL) 800 MG tablet, TAKE ONE TABLET BY MOUTH EVERY 8 HOURS AS NEEDED FOR PAIN WITH FOOD, Disp: 90 tablet, Rfl: 0 .  lisinopril (ZESTRIL) 20 MG tablet, TAKE 1 TABLET BY MOUTH EVERY DAY, Disp: 90 tablet, Rfl: 2 .  Multiple Vitamins-Minerals (ADULT GUMMY PO), Take by mouth. Take by mouth 4 times a week, Disp: , Rfl:  .  rosuvastatin (CRESTOR) 5 MG tablet, Take 1 tablet (5 mg total) by mouth daily., Disp: 90 tablet, Rfl: 3 .  traMADol (ULTRAM) 50 MG tablet, TAKE 1 TABLET BY MOUTH EVERY 6 HOURS AS NEEDED, Disp: 40 tablet, Rfl: 2 .  triamcinolone (NASACORT) 55 MCG/ACT AERO nasal inhaler, Place 2 sprays into the nose daily., Disp: , Rfl:   Allergies  Allergen Reactions  . Amlodipine Other (See Comments)    Edema/joint aches  . Codeine     REACTION: dizziness  . Flexeril [Cyclobenzaprine] Other (See Comments)    sedation  . Lipitor [Atorvastatin]     aches  . Pravastatin     Joint pain.  . Tizanidine     sedation    Objective:   There were no vitals taken for this visit.  Patient is well-developed, well-nourished in no acute distress.  Resting comfortably at home.  Head is normocephalic, atraumatic.  No labored breathing.  Speech is clear and coherent with logical content.  Patient is alert and oriented at baseline.   Assessment and Plan:   1. Flu-like symptoms Negative COVID swabs. Abrupt onset of URI symptoms with aches, chills and fatigue. Concern for influenza. Giving age and medical history will start Tamilfu empirically. Supportive measures and OTC medications reviewed. Patient requesting Tussionex but discussed this cannot be given via our department as we do not prescribe controlled  medications. Discussed options and will send in Rx promethazine-dm. Strict PCP follow-up discussed with patient who voiced understanding and agreement with the plan.  - promethazine-dextromethorphan (PROMETHAZINE-DM) 6.25-15 MG/5ML syrup; Take 5 mLs by mouth 4 (four) times daily as needed for cough.  Dispense: 118 mL; Refill: 0 - oseltamivir (TAMIFLU) 75 MG capsule; Take 1 capsule (75 mg total) by mouth 2 (two) times daily.  Dispense: 10 capsule; Refill: 0 .   Leeanne Rio, PA-C 09/11/2020

## 2020-09-11 NOTE — Patient Instructions (Signed)
Instructions sent to patients MyChart.

## 2020-09-12 ENCOUNTER — Encounter: Payer: Self-pay | Admitting: Family Medicine

## 2020-09-12 ENCOUNTER — Other Ambulatory Visit: Payer: Self-pay | Admitting: Family Medicine

## 2020-09-12 DIAGNOSIS — J069 Acute upper respiratory infection, unspecified: Secondary | ICD-10-CM

## 2020-09-12 MED ORDER — HYDROCOD POLST-CPM POLST ER 10-8 MG/5ML PO SUER
5.0000 mL | Freq: Two times a day (BID) | ORAL | 0 refills | Status: DC | PRN
Start: 2020-09-12 — End: 2020-09-19

## 2020-09-18 ENCOUNTER — Encounter: Payer: Self-pay | Admitting: Family Medicine

## 2020-09-18 DIAGNOSIS — J069 Acute upper respiratory infection, unspecified: Secondary | ICD-10-CM

## 2020-09-19 MED ORDER — HYDROCOD POLST-CPM POLST ER 10-8 MG/5ML PO SUER: 5.0000 mL | Freq: Two times a day (BID) | ORAL | 0 refills | Status: DC | PRN

## 2020-11-08 ENCOUNTER — Ambulatory Visit: Payer: 59 | Admitting: Internal Medicine

## 2020-11-09 ENCOUNTER — Other Ambulatory Visit: Payer: Self-pay | Admitting: Family Medicine

## 2020-12-08 ENCOUNTER — Other Ambulatory Visit: Payer: Self-pay | Admitting: Family Medicine

## 2020-12-09 NOTE — Telephone Encounter (Signed)
Refill request for Tramadol 50 mg tablets  LOV - 07/26/20 Next OV - not scheduled Last refill - 08/21/20 #40/2

## 2020-12-10 NOTE — Telephone Encounter (Signed)
Sent. Thanks.   

## 2021-01-01 ENCOUNTER — Other Ambulatory Visit: Payer: Self-pay | Admitting: Family Medicine

## 2021-01-02 ENCOUNTER — Encounter: Payer: Self-pay | Admitting: Family Medicine

## 2021-01-22 ENCOUNTER — Other Ambulatory Visit: Payer: Self-pay | Admitting: Family Medicine

## 2021-01-22 NOTE — Telephone Encounter (Signed)
Sent. Thanks.   

## 2021-01-22 NOTE — Telephone Encounter (Signed)
Last office visit 07/26/20 for CPE.  Last refilled 07/12/2020 for #30 with 2 refills.  No future appointments.

## 2021-02-18 ENCOUNTER — Other Ambulatory Visit: Payer: Self-pay | Admitting: Family Medicine

## 2021-03-27 ENCOUNTER — Other Ambulatory Visit: Payer: Self-pay | Admitting: Family Medicine

## 2021-03-27 NOTE — Telephone Encounter (Signed)
Refill request for traMADol (ULTRAM) 50 MG tablet  LOV - 09/11/20 Next OV - not scheduled Last refill - 12/10/20 #40/2

## 2021-03-28 NOTE — Telephone Encounter (Signed)
Sent. Thanks.   

## 2021-05-30 ENCOUNTER — Other Ambulatory Visit: Payer: Self-pay | Admitting: Family Medicine

## 2021-06-20 ENCOUNTER — Telehealth: Payer: Self-pay | Admitting: Acute Care

## 2021-06-20 NOTE — Telephone Encounter (Signed)
Left voicemail and call back number for patient to call to schedule annual LDCT

## 2021-07-25 ENCOUNTER — Other Ambulatory Visit: Payer: Self-pay | Admitting: Family Medicine

## 2021-07-25 NOTE — Telephone Encounter (Signed)
Refill request for TRAMADOL HCL 50 MG TABLET ? ?LOV - 07/26/20 ?Next OV - not scheduled ?Last refill - 03/28/21 #40/2 ? ? ?

## 2021-07-27 NOTE — Telephone Encounter (Unsigned)
Sent. Thanks.  ?Please schedule yearly visit when possible.  ?

## 2021-08-07 ENCOUNTER — Other Ambulatory Visit: Payer: Self-pay | Admitting: Family Medicine

## 2021-08-08 NOTE — Telephone Encounter (Signed)
Patient's CPE is scheduled 5.8.23 ?

## 2021-08-08 NOTE — Telephone Encounter (Signed)
Refill request for ibuprofen 800 mg tabs ? ?LOV - 07/26/20 ?Next OV - not scheduled yet; lmtcb ?Last refill - 01/01/21 #90/0 ? ?

## 2021-08-08 NOTE — Telephone Encounter (Signed)
Patient overdue for appt with Dr. Damita Dunnings; [facility] to schedule CPE. ?

## 2021-08-09 ENCOUNTER — Other Ambulatory Visit: Payer: Self-pay | Admitting: Family Medicine

## 2021-08-11 NOTE — Telephone Encounter (Signed)
Refill request for carisoprodol  350 mg tabs ? ?LOV - 07/26/20 ?Next OV - 09/15/21 ?Last refill - 01/22/21 #30/2 ? ?

## 2021-08-14 ENCOUNTER — Other Ambulatory Visit: Payer: Self-pay | Admitting: Internal Medicine

## 2021-09-06 ENCOUNTER — Other Ambulatory Visit: Payer: Self-pay | Admitting: Internal Medicine

## 2021-09-06 ENCOUNTER — Other Ambulatory Visit: Payer: Self-pay | Admitting: Family Medicine

## 2021-09-07 ENCOUNTER — Other Ambulatory Visit: Payer: Self-pay | Admitting: Family Medicine

## 2021-09-07 DIAGNOSIS — E785 Hyperlipidemia, unspecified: Secondary | ICD-10-CM

## 2021-09-08 ENCOUNTER — Other Ambulatory Visit (INDEPENDENT_AMBULATORY_CARE_PROVIDER_SITE_OTHER): Payer: 59

## 2021-09-08 DIAGNOSIS — E785 Hyperlipidemia, unspecified: Secondary | ICD-10-CM

## 2021-09-08 LAB — COMPREHENSIVE METABOLIC PANEL
ALT: 19 U/L (ref 0–53)
AST: 21 U/L (ref 0–37)
Albumin: 4.4 g/dL (ref 3.5–5.2)
Alkaline Phosphatase: 65 U/L (ref 39–117)
BUN: 21 mg/dL (ref 6–23)
CO2: 27 mEq/L (ref 19–32)
Calcium: 9.1 mg/dL (ref 8.4–10.5)
Chloride: 106 mEq/L (ref 96–112)
Creatinine, Ser: 0.9 mg/dL (ref 0.40–1.50)
GFR: 90.27 mL/min (ref >60.00)
Glucose, Bld: 97 mg/dL (ref 70–99)
Potassium: 4.2 mEq/L (ref 3.5–5.1)
Sodium: 140 mEq/L (ref 135–145)
Total Bilirubin: 0.5 mg/dL (ref 0.2–1.2)
Total Protein: 7.1 g/dL (ref 6.0–8.3)

## 2021-09-08 LAB — LIPID PANEL
Cholesterol: 138 mg/dL (ref 0–200)
HDL: 36.7 mg/dL — ABNORMAL LOW (ref >39.00)
LDL Cholesterol: 74 mg/dL (ref 0–99)
NonHDL: 101.02
Total CHOL/HDL Ratio: 4
Triglycerides: 135 mg/dL (ref 0.0–149.0)
VLDL: 27 mg/dL (ref 0.0–40.0)

## 2021-09-15 ENCOUNTER — Ambulatory Visit (INDEPENDENT_AMBULATORY_CARE_PROVIDER_SITE_OTHER): Payer: 59 | Admitting: Family Medicine

## 2021-09-15 ENCOUNTER — Encounter: Payer: Self-pay | Admitting: Family Medicine

## 2021-09-15 VITALS — BP 120/72 | HR 87 | Temp 97.8°F | Ht 68.0 in | Wt 263.0 lb

## 2021-09-15 DIAGNOSIS — Z122 Encounter for screening for malignant neoplasm of respiratory organs: Secondary | ICD-10-CM

## 2021-09-15 DIAGNOSIS — I10 Essential (primary) hypertension: Secondary | ICD-10-CM

## 2021-09-15 DIAGNOSIS — M62838 Other muscle spasm: Secondary | ICD-10-CM

## 2021-09-15 DIAGNOSIS — E785 Hyperlipidemia, unspecified: Secondary | ICD-10-CM

## 2021-09-15 DIAGNOSIS — Z7189 Other specified counseling: Secondary | ICD-10-CM

## 2021-09-15 DIAGNOSIS — Z0000 Encounter for general adult medical examination without abnormal findings: Secondary | ICD-10-CM

## 2021-09-15 DIAGNOSIS — M25569 Pain in unspecified knee: Secondary | ICD-10-CM

## 2021-09-15 MED ORDER — LISINOPRIL 20 MG PO TABS: 20.0000 mg | ORAL_TABLET | Freq: Every day | ORAL | 3 refills | Status: DC

## 2021-09-15 MED ORDER — ROSUVASTATIN CALCIUM 5 MG PO TABS: 5.0000 mg | ORAL_TABLET | Freq: Every day | ORAL | 3 refills | Status: DC

## 2021-09-15 NOTE — Patient Instructions (Addendum)
Take care.  Glad to see you. Update me as needed.  

## 2021-09-15 NOTE — Progress Notes (Signed)
CPE- See plan.  Routine anticipatory guidance given to patient.  See health maintenance.  The possibility exists that previously documented standard health maintenance information may have been brought forward from a previous encounter into this note.  If needed, that same information has been updated to reflect the current situation based on today's encounter.   ? ?Tetanus 2017 ?PNA 2012  ?Flu yearly  ?Shingrix 2022.  ?covid up to date.   ?Prostate cancer screening and PSA options (with potential risks and benefits of testing vs not testing) were discussed along with recent recs/guidelines.  He declined testing PSA at this point.  ?Colonoscopy 2019 ?Advance directive d/w pt. Would have his wife designated if patient were incapacitated. ?HCV prev neg.  ?HIV prev neg.  ?Lung cancer screening d/w pt. Referred again.   ? ?R thumb in brace.  That helps with pain locally.   D/w pt.  ? ?Elevated Cholesterol: ?Using medications without problems: yes ?Muscle aches: likely not from statin.   ?Diet compliance: d/w pt.  ?Exercise: d/w pt.  ? ?Hypertension:    ?Using medication without problems or lightheadedness:  yes ?Chest pain with exertion:no ?Edema:no ?Short of breath:no ? ?Aches.  Using soma prn.  Used for neck pain.  Tramadol prn.  Used for knee pain.  No ADE on either.  Routine nsaids and med cautions d/w pt.  ? ?PMH and SH reviewed ? ?Meds, vitals, and allergies reviewed.  ? ?ROS: Per HPI.  Unless specifically indicated otherwise in HPI, the patient denies: ? ?General: fever. ?Eyes: acute vision changes ?ENT: sore throat ?Cardiovascular: chest pain ?Respiratory: SOB ?GI: vomiting ?GU: dysuria ?Musculoskeletal: acute back pain ?Derm: acute rash ?Neuro: acute motor dysfunction ?Psych: worsening mood ?Endocrine: polydipsia ?Heme: bleeding ?Allergy: hayfever ? ?GEN: nad, alert and oriented ?HEENT: ncat ?NECK: supple w/o LA ?CV: rrr. ?PULM: ctab, no inc wob ?ABD: soft, +bs ?EXT: no edema ?SKIN: no acute rash ?R thumb in  brace at baseline.  ?

## 2021-09-17 NOTE — Assessment & Plan Note (Signed)
?  Tetanus 2017 ?PNA 2012  ?Flu yearly  ?Shingrix 2022.  ?covid up to date.   ?Prostate cancer screening and PSA options (with potential risks and benefits of testing vs not testing) were discussed along with recent recs/guidelines.  He declined testing PSA at this point.  ?Colonoscopy 2019 ?Advance directive d/w pt. Would have his wife designated if patient were incapacitated. ?HCV prev neg.  ?HIV prev neg.  ?Lung cancer screening d/w pt. Referred again.   ?

## 2021-09-17 NOTE — Assessment & Plan Note (Signed)
Continue lisinopril.   Labs d/w pt.  ?

## 2021-09-17 NOTE — Assessment & Plan Note (Signed)
Continue crestor.   Labs d/w pt.  ?

## 2021-09-17 NOTE — Assessment & Plan Note (Signed)
Advance directive d/w pt. Would have his wife designated if patient were incapacitated. ?

## 2021-09-17 NOTE — Assessment & Plan Note (Signed)
Using soma prn.  Used for neck pain.  Tramadol prn.  Used for knee pain.  No ADE on either.  Routine nsaids and med cautions d/w pt.  Would continue as is.  Not sedated.   ?

## 2021-10-29 ENCOUNTER — Telehealth: Payer: Self-pay | Admitting: Acute Care

## 2021-10-29 NOTE — Telephone Encounter (Signed)
Left VM and call back number to schedule annual LDCT

## 2021-10-30 ENCOUNTER — Other Ambulatory Visit: Payer: Self-pay | Admitting: Family Medicine

## 2021-12-25 ENCOUNTER — Other Ambulatory Visit: Payer: Self-pay | Admitting: *Deleted

## 2021-12-25 DIAGNOSIS — Z87891 Personal history of nicotine dependence: Secondary | ICD-10-CM

## 2021-12-25 DIAGNOSIS — Z122 Encounter for screening for malignant neoplasm of respiratory organs: Secondary | ICD-10-CM

## 2021-12-30 ENCOUNTER — Other Ambulatory Visit: Payer: Self-pay | Admitting: Family Medicine

## 2021-12-30 NOTE — Telephone Encounter (Signed)
Refill request for TRAMADOL HCL 50 MG TABLET  LOV - 09/15/21 Next OV - not scheduled Last refill - 07/27/21 #40/2

## 2022-01-01 ENCOUNTER — Telehealth: Payer: Self-pay

## 2022-01-01 NOTE — Telephone Encounter (Signed)
Prior auth started and approved for traMADol HCl '50MG'$  tablets. Juan Thompson Key: [id number] - PA Case ID: [id number] - Rx #: [id number] Approved today Your PA request has been approved. Additional information will be provided in the approval communication.   Patient notified via mychart.

## 2022-01-02 NOTE — Telephone Encounter (Signed)
Approval letter sent to scanning.

## 2022-01-05 ENCOUNTER — Ambulatory Visit
Admission: RE | Admit: 2022-01-05 | Discharge: 2022-01-05 | Disposition: A | Discharge status: Home / Self Care | Payer: 59 | Source: Ambulatory Visit | Attending: Acute Care | Admitting: Acute Care

## 2022-01-05 DIAGNOSIS — Z122 Encounter for screening for malignant neoplasm of respiratory organs: Secondary | ICD-10-CM

## 2022-01-05 DIAGNOSIS — Z87891 Personal history of nicotine dependence: Secondary | ICD-10-CM | POA: Diagnosis not present

## 2022-01-06 ENCOUNTER — Other Ambulatory Visit: Payer: Self-pay | Admitting: Acute Care

## 2022-01-06 DIAGNOSIS — Z87891 Personal history of nicotine dependence: Secondary | ICD-10-CM

## 2022-01-06 DIAGNOSIS — Z122 Encounter for screening for malignant neoplasm of respiratory organs: Secondary | ICD-10-CM

## 2022-01-23 ENCOUNTER — Ambulatory Visit (INDEPENDENT_AMBULATORY_CARE_PROVIDER_SITE_OTHER): Payer: 59 | Admitting: Family Medicine

## 2022-01-23 ENCOUNTER — Encounter: Payer: Self-pay | Admitting: Family Medicine

## 2022-01-23 DIAGNOSIS — E785 Hyperlipidemia, unspecified: Secondary | ICD-10-CM | POA: Diagnosis not present

## 2022-01-23 NOTE — Progress Notes (Unsigned)
HLD.  He stopped crestor.  Joint pain is better off crestor, within a few days.   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 No definite coronary artery calcifications.  Glucosamine and chondroitin helped his thumb pain.    Meds, vitals, and allergies reviewed.   ROS: Per HPI unless specifically indicated in ROS section

## 2022-01-23 NOTE — Patient Instructions (Addendum)
I would stay off crestor for now.  Keep working on diet and exercise and update me as needed.  Take care.  Glad to see you.

## 2022-01-25 NOTE — Assessment & Plan Note (Signed)
We discussed options.  Would avoid statins at this point.  Discussed nonstatin options versus diet and exercise and rechecking his labs later on.  Reasonable to keep working on diet and exercise and update me as needed.

## 2022-03-21 ENCOUNTER — Other Ambulatory Visit: Payer: Self-pay | Admitting: Family Medicine

## 2022-03-24 ENCOUNTER — Other Ambulatory Visit: Payer: Self-pay | Admitting: Family Medicine

## 2022-03-24 NOTE — Telephone Encounter (Signed)
Refill request for CARISOPRODOL 350 MG TABLET   LOV - 01/23/22 Next OV - not scheduled Last refill - 08/12/21 #30/2

## 2022-05-20 ENCOUNTER — Other Ambulatory Visit: Payer: Self-pay | Admitting: Family Medicine

## 2022-05-20 NOTE — Telephone Encounter (Signed)
Refill request for TRAMADOL HCL 50 MG TABLET   LOV - 01/23/22 Next OV - 05/21/22 Last refill - 12/31/21 #40/2

## 2022-05-21 ENCOUNTER — Encounter: Payer: Self-pay | Admitting: Family Medicine

## 2022-05-21 ENCOUNTER — Ambulatory Visit (INDEPENDENT_AMBULATORY_CARE_PROVIDER_SITE_OTHER): Payer: PPO | Admitting: Family Medicine

## 2022-05-21 ENCOUNTER — Ambulatory Visit (INDEPENDENT_AMBULATORY_CARE_PROVIDER_SITE_OTHER)
Admission: RE | Admit: 2022-05-21 | Discharge: 2022-05-21 | Disposition: A | Discharge status: Home / Self Care | Payer: PPO | Source: Ambulatory Visit | Attending: Family Medicine | Admitting: Family Medicine

## 2022-05-21 VITALS — BP 122/60 | HR 85 | Temp 97.8°F | Ht 68.0 in | Wt 277.0 lb

## 2022-05-21 DIAGNOSIS — M79642 Pain in left hand: Secondary | ICD-10-CM

## 2022-05-21 DIAGNOSIS — M79643 Pain in unspecified hand: Secondary | ICD-10-CM | POA: Diagnosis not present

## 2022-05-21 MED ORDER — PREDNISONE 10 MG PO TABS: ORAL_TABLET | ORAL | 0 refills | Status: DC

## 2022-05-21 NOTE — Patient Instructions (Addendum)
Prednisone: Take 2 a day for 5 days, then 1 a day for 5 days, with food. Don't take with aleve/ibuprofen. Go to the lab on the way out.   If you have mychart we'll likely use that to update you.    Take care.  Glad to see you.  RSV vaccine when not on prednisone, at pharmacy.

## 2022-05-21 NOTE — Progress Notes (Signed)
R thumb pain prev, had seen ortho in the past.  Steroids helped at the time.   Has L 3rd and 4th MCP pain now.  No trauma. No redness.  Not puffy.  Rest of the hand is not symptomatic o/w.  Normal sensation.  Has been taking ibuprofen BID.    Discussed RSV vaccine.  See after visit summary.  Meds, vitals, and allergies reviewed.   ROS: Per HPI unless specifically indicated in ROS section   Nad Ncat Left hand with normal range of motion except for pain on ROM and L 3rd and 4th MCP (but no motor deficit otherwise).  Not puffy or red.  Distally NV intact.

## 2022-05-24 DIAGNOSIS — Z8739 Personal history of other diseases of the musculoskeletal system and connective tissue: Secondary | ICD-10-CM | POA: Insufficient documentation

## 2022-05-24 DIAGNOSIS — M79643 Pain in unspecified hand: Secondary | ICD-10-CM | POA: Insufficient documentation

## 2022-05-24 NOTE — Assessment & Plan Note (Signed)
Likely arthritic flare.  Discussed options.  Start prednisone with routine steroid cautions.  Check plain films update me as needed.

## 2022-06-01 ENCOUNTER — Telehealth: Payer: Self-pay | Admitting: Family Medicine

## 2022-06-01 MED ORDER — PREDNISONE 10 MG PO TABS: ORAL_TABLET | ORAL | 0 refills | Status: DC

## 2022-06-01 NOTE — Telephone Encounter (Signed)
Called and spoke to patient, he states the pain in his left hand is still present. It has improved since taking the prednisone, but he still cannot grab something pain free and cannot make a fist, close his hand all the way pain free. Patient would like advice on what to do or how to proceed. Please advise.

## 2022-06-01 NOTE — Telephone Encounter (Signed)
Patient notified as instructed by telephone and verbalized understanding. 

## 2022-06-01 NOTE — Telephone Encounter (Signed)
Reviewed chart. Reasonable to do another course of prednisone taper - sent to pharmacy. Rec schedule OV if not better with this.  Update sooner if worsening pain, redness, swelling, warmth to hand, or fever.   Lab Results  Component Value Date   CREATININE 0.90 09/08/2021   BUN 21 09/08/2021   NA 140 09/08/2021   K 4.2 09/08/2021   CL 106 09/08/2021   CO2 27 09/08/2021   No results found for: "HGBA1C"

## 2022-06-01 NOTE — Telephone Encounter (Signed)
Pt called stating he need more of the meds, predniSONE (DELTASONE) 10 MG. Pt stated he was told by Janett Billow, to let her know if he needed more. [id number] [place] [phone]

## 2022-08-19 ENCOUNTER — Encounter: Payer: Self-pay | Admitting: Gastroenterology

## 2022-09-11 ENCOUNTER — Other Ambulatory Visit: Payer: Self-pay | Admitting: Family Medicine

## 2022-09-11 NOTE — Telephone Encounter (Signed)
Sent. Thanks.   

## 2022-09-11 NOTE — Telephone Encounter (Signed)
Refill request for CARISOPRODOL 350 MG TABLET   LOV - 05/21/22 Next OV - not scheduled Last refill - 03/25/22 #30/2

## 2022-09-20 ENCOUNTER — Other Ambulatory Visit: Payer: Self-pay | Admitting: Family Medicine

## 2022-10-01 ENCOUNTER — Telehealth: Payer: Self-pay | Admitting: Family Medicine

## 2022-10-01 MED ORDER — PREDNISONE 10 MG PO TABS: ORAL_TABLET | ORAL | 0 refills | Status: DC

## 2022-10-01 NOTE — Telephone Encounter (Signed)
Sent.  If not better, then needs recheck. Thanks.  

## 2022-10-01 NOTE — Telephone Encounter (Signed)
Left voicemail per dpr advising patient that prescription has been sent in and to schedule for recheck if not better once he finished that course.

## 2022-10-01 NOTE — Telephone Encounter (Signed)
  predniSONE (DELTASONE) 10 MG tablet   Patient contacted the office and was wondering if Dr. Para March could possibly fill prescription listed above. States it was originally given for some pain in his R thumb, patient says he is having the same pain again. Please advise, thank you.

## 2022-10-23 ENCOUNTER — Other Ambulatory Visit: Payer: Self-pay | Admitting: Family Medicine

## 2022-10-23 NOTE — Telephone Encounter (Signed)
Sent. Thanks.   

## 2022-10-23 NOTE — Telephone Encounter (Signed)
Refill request for TRAMADOL HCL 50 MG TABLET   LOV - 05/21/22 Next OV - not scheduled Last refill - 05/20/22 #40/2

## 2022-10-27 ENCOUNTER — Other Ambulatory Visit: Payer: Self-pay | Admitting: Acute Care

## 2022-10-27 DIAGNOSIS — Z122 Encounter for screening for malignant neoplasm of respiratory organs: Secondary | ICD-10-CM

## 2022-10-27 DIAGNOSIS — Z87891 Personal history of nicotine dependence: Secondary | ICD-10-CM

## 2022-12-01 ENCOUNTER — Telehealth: Payer: Self-pay

## 2022-12-01 NOTE — Telephone Encounter (Signed)
Reached out to patient to schedule follow up.  He will call back to set up.  Juan Thompson [facility]

## 2022-12-10 ENCOUNTER — Other Ambulatory Visit: Payer: Self-pay | Admitting: Family Medicine

## 2023-01-07 ENCOUNTER — Ambulatory Visit: Payer: PPO

## 2023-01-08 ENCOUNTER — Encounter: Payer: Self-pay | Admitting: Family Medicine

## 2023-01-08 ENCOUNTER — Ambulatory Visit (INDEPENDENT_AMBULATORY_CARE_PROVIDER_SITE_OTHER)
Admission: RE | Admit: 2023-01-08 | Discharge: 2023-01-08 | Disposition: A | Discharge status: Home / Self Care | Payer: PPO | Source: Ambulatory Visit | Attending: Family Medicine | Admitting: Family Medicine

## 2023-01-08 ENCOUNTER — Ambulatory Visit (INDEPENDENT_AMBULATORY_CARE_PROVIDER_SITE_OTHER): Payer: PPO | Admitting: Family Medicine

## 2023-01-08 VITALS — BP 120/78 | HR 78 | Temp 98.0°F | Ht 68.0 in | Wt 268.0 lb

## 2023-01-08 DIAGNOSIS — M25531 Pain in right wrist: Secondary | ICD-10-CM

## 2023-01-08 DIAGNOSIS — M25539 Pain in unspecified wrist: Secondary | ICD-10-CM

## 2023-01-08 DIAGNOSIS — M19031 Primary osteoarthritis, right wrist: Secondary | ICD-10-CM | POA: Diagnosis not present

## 2023-01-08 MED ORDER — PREDNISONE 10 MG PO TABS: ORAL_TABLET | ORAL | 0 refills | Status: DC

## 2023-01-08 NOTE — Patient Instructions (Addendum)
Go to the lab on the way out.   If you have mychart we'll likely use that to update you.     Please call [facility] [phone].  Take prednisone with food.  Let me know if the pain in your leg radiates from the hip to the knee.
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
## 2023-01-10 ENCOUNTER — Other Ambulatory Visit: Payer: Self-pay | Admitting: Family Medicine

## 2023-01-11 DIAGNOSIS — M25539 Pain in unspecified wrist: Secondary | ICD-10-CM | POA: Insufficient documentation

## 2023-01-11 NOTE — Assessment & Plan Note (Signed)
See notes on imaging.  Restart prednisone with steroid caution.  It is a separate issue but I asked him to notice if his back pain radiates down to the right knee as that would be an important piece of information to note.  He can also see if his back/knee pain improves on prednisone.   25 minutes were devoted to patient care in this encounter (this includes time spent reviewing the patient's file/history, interviewing and examining the patient, counseling/reviewing plan with patient).

## 2023-01-12 NOTE — Telephone Encounter (Signed)
Refill request for carisoprodol (SOMA) 350 MG table   LOV - 01/08/23 Next OV - not scheduled Last refill - 09/11/22 #30/3

## 2023-01-14 ENCOUNTER — Telehealth: Payer: Self-pay | Admitting: Family Medicine

## 2023-01-14 NOTE — Telephone Encounter (Signed)
Patient called in and stated that the pain is not going from his hip to his knee. He stated that it isn't like the funny bone thing.

## 2023-01-15 NOTE — Telephone Encounter (Signed)
Did his pain improve on prednisone?  How is he doing now?  Where does he have pain now?

## 2023-01-15 NOTE — Telephone Encounter (Signed)
Left message on vm per DPR with Dr Lianne Bushy questions for the pt.

## 2023-01-16 ENCOUNTER — Encounter: Payer: Self-pay | Admitting: Family Medicine

## 2023-01-18 NOTE — Telephone Encounter (Signed)
See my chart message

## 2023-01-19 ENCOUNTER — Other Ambulatory Visit: Payer: Self-pay | Admitting: Family Medicine

## 2023-01-19 ENCOUNTER — Encounter: Payer: Self-pay | Admitting: *Deleted

## 2023-01-19 DIAGNOSIS — M79643 Pain in unspecified hand: Secondary | ICD-10-CM

## 2023-03-09 ENCOUNTER — Other Ambulatory Visit: Payer: Self-pay | Admitting: Family Medicine

## 2023-03-16 ENCOUNTER — Other Ambulatory Visit: Payer: Self-pay | Admitting: Family Medicine

## 2023-03-18 ENCOUNTER — Ambulatory Visit: Payer: PPO

## 2023-03-18 ENCOUNTER — Other Ambulatory Visit: Payer: Self-pay | Admitting: Family Medicine

## 2023-03-18 DIAGNOSIS — E785 Hyperlipidemia, unspecified: Secondary | ICD-10-CM

## 2023-03-18 DIAGNOSIS — I10 Essential (primary) hypertension: Secondary | ICD-10-CM

## 2023-03-19 ENCOUNTER — Other Ambulatory Visit (INDEPENDENT_AMBULATORY_CARE_PROVIDER_SITE_OTHER): Payer: PPO

## 2023-03-19 ENCOUNTER — Other Ambulatory Visit: Payer: PPO

## 2023-03-19 DIAGNOSIS — I10 Essential (primary) hypertension: Secondary | ICD-10-CM | POA: Diagnosis not present

## 2023-03-19 LAB — LIPID PANEL
Cholesterol: 198 mg/dL (ref 0–200)
HDL: 32.6 mg/dL — ABNORMAL LOW (ref >39.00)
LDL Cholesterol: 130 mg/dL — ABNORMAL HIGH (ref 0–99)
NonHDL: 165.73
Total CHOL/HDL Ratio: 6
Triglycerides: 178 mg/dL — ABNORMAL HIGH (ref 0.0–149.0)
VLDL: 35.6 mg/dL (ref 0.0–40.0)

## 2023-03-19 LAB — COMPREHENSIVE METABOLIC PANEL
ALT: 18 U/L (ref 0–53)
AST: 20 U/L (ref 0–37)
Albumin: 4.3 g/dL (ref 3.5–5.2)
Alkaline Phosphatase: 70 U/L (ref 39–117)
BUN: 20 mg/dL (ref 6–23)
CO2: 31 meq/L (ref 19–32)
Calcium: 9.4 mg/dL (ref 8.4–10.5)
Chloride: 104 meq/L (ref 96–112)
Creatinine, Ser: 1.06 mg/dL (ref 0.40–1.50)
GFR: 73.39 mL/min (ref >60.00)
Glucose, Bld: 96 mg/dL (ref 70–99)
Potassium: 4.6 meq/L (ref 3.5–5.1)
Sodium: 141 meq/L (ref 135–145)
Total Bilirubin: 0.7 mg/dL (ref 0.2–1.2)
Total Protein: 6.8 g/dL (ref 6.0–8.3)

## 2023-03-25 ENCOUNTER — Ambulatory Visit (INDEPENDENT_AMBULATORY_CARE_PROVIDER_SITE_OTHER): Payer: PPO | Admitting: Family Medicine

## 2023-03-25 ENCOUNTER — Encounter: Payer: Self-pay | Admitting: Family Medicine

## 2023-03-25 ENCOUNTER — Ambulatory Visit
Admission: RE | Admit: 2023-03-25 | Discharge: 2023-03-25 | Disposition: A | Discharge status: Home / Self Care | Payer: PPO | Source: Ambulatory Visit | Attending: Family Medicine | Admitting: Family Medicine

## 2023-03-25 VITALS — BP 128/78 | HR 71 | Temp 98.6°F | Ht 69.0 in | Wt 268.2 lb

## 2023-03-25 DIAGNOSIS — M25561 Pain in right knee: Secondary | ICD-10-CM

## 2023-03-25 DIAGNOSIS — Z0000 Encounter for general adult medical examination without abnormal findings: Secondary | ICD-10-CM | POA: Diagnosis not present

## 2023-03-25 DIAGNOSIS — I10 Essential (primary) hypertension: Secondary | ICD-10-CM

## 2023-03-25 DIAGNOSIS — M62838 Other muscle spasm: Secondary | ICD-10-CM

## 2023-03-25 DIAGNOSIS — M1711 Unilateral primary osteoarthritis, right knee: Secondary | ICD-10-CM | POA: Diagnosis not present

## 2023-03-25 DIAGNOSIS — Z7189 Other specified counseling: Secondary | ICD-10-CM

## 2023-03-25 MED ORDER — PREDNISONE 10 MG PO TABS: ORAL_TABLET | ORAL | 0 refills | Status: DC

## 2023-03-25 NOTE — Progress Notes (Signed)
I have personally reviewed the Medicare Annual Wellness questionnaire and have noted 1. The patient's medical and social history 2. Their use of alcohol, tobacco or illicit drugs 3. Their current medications and supplements 4. The patient's functional ability including ADL's, fall risks, home safety risks and hearing or visual             impairment. 5. Diet and physical activities 6. Evidence for depression or mood disorders  The patients weight, height, BMI have been recorded in the chart and visual acuity is per eye clinic.  I have made referrals, counseling and provided education to the patient based review of the above and I have provided the pt with a written personalized care plan for preventive services.  Provider list updated- see scanned forms.  Routine anticipatory guidance given to patient.  See health maintenance. The possibility exists that previously documented standard health maintenance information may have been brought forward from a previous encounter into this note.  If needed, that same information has been updated to reflect the current situation based on today's encounter.    Flu 2024 Shingles previously done PNA discussed with patient Tetanus 2017 COVID-vaccine previously done  Colon cancer screening discussed with patient.  Information given to him to call about follow-up 2024. Prostate cancer screening deferred 2024. Advance directive- wife designated if patient were incapacitated. Cognitive function addressed- see scanned forms- and if abnormal then additional documentation follows.   Lung cancer screening program contact information given to patient 2024  See AVS.   In addition to Medicare Wellness, follow up visit for the below conditions:  Hypertension:    Using medication without problems or lightheadedness: yes Chest pain with exertion:no Edema:no Short of breath:no Labs d/w pt.   Intolerant of CPAP.  Statin intolerant.    Had used tramadol and  soma for back/shoulder pain.  It helped.  No ADE on med.   R>L knee pain.  H/o distant R knee injury.  He had been stretching at baseline.  Prev improved with prednisone course.  He has more knee pain with walking downhill.  He doesn't have radicular pain down to the R foot.  R knee can get puffy.    He is using a wrist/hand brace at night for pain.  That helps some.  More pain with more activity.    PMH and SH reviewed  Meds, vitals, and allergies reviewed.   ROS: Per HPI.  Unless specifically indicated otherwise in HPI, the patient denies:  General: fever. Eyes: acute vision changes ENT: sore throat Cardiovascular: chest pain Respiratory: SOB GI: vomiting GU: dysuria Musculoskeletal: acute back pain Derm: acute rash Neuro: acute motor dysfunction Psych: worsening mood Endocrine: polydipsia Heme: bleeding Allergy: hayfever  GEN: nad, alert and oriented HEENT: ncat NECK: supple w/o LA CV: rrr. PULM: ctab, no inc wob ABD: soft, +bs EXT: no edema SKIN: no acute rash R knee not ttp at time of exam.  ACL MCL LCL feel solid.  Normal range of motion.  Patella not tender.  Medial lateral joint line not tender.  No bruising.  Able to bear weight.

## 2023-03-25 NOTE — Patient Instructions (Addendum)
Please call about the lung CT.   [facility] at [facility] [address] [place], [place] [address] [phone]  Pneumonia 20 vaccine when possible.   Please call GI about follow up.   Weight bearing knee films on the way out.

## 2023-03-28 ENCOUNTER — Telehealth: Payer: Self-pay | Admitting: Family Medicine

## 2023-03-28 DIAGNOSIS — M25561 Pain in right knee: Secondary | ICD-10-CM

## 2023-03-28 NOTE — Assessment & Plan Note (Signed)
Advance directive- wife designated if patient were incapacitated.  

## 2023-03-28 NOTE — Assessment & Plan Note (Signed)
Had used tramadol and soma for back/shoulder pain.  It helped.  No ADE on med.  Continue as is.

## 2023-03-28 NOTE — Assessment & Plan Note (Signed)
Labs d/w pt.   Intolerant of CPAP.  Statin intolerant.   Continue lisinopril. Diet and exercise discussed with patient.

## 2023-03-28 NOTE — Assessment & Plan Note (Signed)
Flu 2024 Shingles previously done PNA discussed with patient Tetanus 2017 COVID-vaccine previously done  Colon cancer screening discussed with patient.  Information given to him to call about follow-up 2024. Prostate cancer screening deferred 2024. Advance directive- wife designated if patient were incapacitated. Cognitive function addressed- see scanned forms- and if abnormal then additional documentation follows.   Lung cancer screening program contact information given to patient 2024

## 2023-03-28 NOTE — Assessment & Plan Note (Signed)
See notes on plain films.  He can take short course of prednisone if needed with routine NSAID cautions discussed with patient.  He agrees to plan.

## 2023-03-28 NOTE — Telephone Encounter (Signed)
Please update patient.  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  Let me know if he wants me to go ahead and put in the referral.  Thanks.

## 2023-03-29 NOTE — Telephone Encounter (Signed)
Called patient reviewed all information and repeated back to me. Will call if any questions.  Would like referral to [facility]. He will reach out if not called by them in 2 weeks for appointment

## 2023-03-30 NOTE — Telephone Encounter (Signed)
Done. Thanks.

## 2023-04-01 ENCOUNTER — Encounter: Payer: Self-pay | Admitting: *Deleted

## 2023-04-11 ENCOUNTER — Other Ambulatory Visit: Payer: Self-pay | Admitting: Family Medicine

## 2023-05-10 ENCOUNTER — Other Ambulatory Visit: Payer: Self-pay | Admitting: Family Medicine

## 2023-05-11 NOTE — Telephone Encounter (Signed)
 Refill request for carisoprodol (SOMA) 350 MG table    LOV - 03/25/23 medicare wellness Next OV - not scheduled Last refill - 01/13/23 #30/3

## 2023-05-19 DIAGNOSIS — M25561 Pain in right knee: Secondary | ICD-10-CM | POA: Insufficient documentation

## 2023-05-19 DIAGNOSIS — M170 Bilateral primary osteoarthritis of knee: Secondary | ICD-10-CM | POA: Diagnosis not present

## 2023-05-19 DIAGNOSIS — M25562 Pain in left knee: Secondary | ICD-10-CM | POA: Insufficient documentation

## 2023-06-29 ENCOUNTER — Other Ambulatory Visit: Payer: Self-pay | Admitting: Family Medicine

## 2023-06-29 NOTE — Telephone Encounter (Signed)
LAST REFILL: predniSONE (DELTASONE) 10 MG tablet 03/25/23 9 tablets 0 refills LAST OFFICE VISIT: 03/25/23  NEXT OFFICE VISIT: NOTHING SCHEDULED

## 2023-06-30 MED ORDER — PREDNISONE 10 MG PO TABS: ORAL_TABLET | ORAL | 0 refills | Status: DC

## 2023-06-30 NOTE — Telephone Encounter (Signed)
Sent.  Please get update on patient.  Thanks.  ?

## 2023-07-01 NOTE — Telephone Encounter (Signed)
 Left voicemail for patient to return call to office.

## 2023-07-02 NOTE — Telephone Encounter (Signed)
Left message for patient to return call. I was checking to see how the patient was doing.

## 2023-07-28 ENCOUNTER — Other Ambulatory Visit: Payer: Self-pay | Admitting: Family Medicine

## 2023-08-24 ENCOUNTER — Other Ambulatory Visit: Payer: Self-pay | Admitting: Family Medicine

## 2023-08-25 ENCOUNTER — Other Ambulatory Visit: Payer: Self-pay | Admitting: Family Medicine

## 2023-08-26 NOTE — Telephone Encounter (Signed)
 LOV:03/25/23 NOV: NOTHING SCHEDULED LAST REFILL:  TRAMADOL HCL 50 MG TABLET

## 2023-08-26 NOTE — Telephone Encounter (Signed)
 ERx

## 2023-08-31 ENCOUNTER — Other Ambulatory Visit: Payer: Self-pay | Admitting: Family Medicine

## 2023-08-31 NOTE — Telephone Encounter (Signed)
 Received refill request for controlled substance, carisoprodol  (SOMA ) 350 MG tablet. Per chart review CMA attempted to send in on 08/25/23, not received by pharmacy due to not being sent by provider.    LOV 03/25/23  NOV nothing scheduled  Previous last refill: 05/11/23 # 30 w/ 3 refills

## 2023-09-01 ENCOUNTER — Other Ambulatory Visit: Payer: Self-pay | Admitting: Family Medicine

## 2023-09-01 NOTE — Telephone Encounter (Signed)
Sent via other note.

## 2023-09-01 NOTE — Telephone Encounter (Signed)
 Copied from CRM [id number]. Topic: Clinical - Medication Refill >> Sep 01, 2023 10:00 AM Yolande Hench C wrote: Most Recent Primary Care Visit:  Provider: Donnie Galea  Department: [facility]  Visit Type: PHYSICAL  Date: 03/25/2023  Medication: carisoprodol  (SOMA ) 350 MG tablet  Has the patient contacted their pharmacy? Yes, patients pharmacy said they did not receive a RX refill request for this medication (Agent: If no, request that the patient contact the pharmacy for the refill. If patient does not wish to contact the pharmacy document the reason why and proceed with request.) (Agent: If yes, when and what did the pharmacy advise?)  Is this the correct pharmacy for this prescription? Yes If no, delete pharmacy and type the correct one.  This is the patient's preferred pharmacy:  [id number] - [address] [place] [address] Phone: [phone] Fax: [phone]   Has the prescription been filled recently? No  Is the patient out of the medication? Yes  Has the patient been seen for an appointment in the last year OR does the patient have an upcoming appointment? Yes  Can we respond through MyChart? Yes  Agent: Please be advised that Rx refills may take up to 3 business days. We ask that you follow-up with your pharmacy.

## 2023-09-01 NOTE — Telephone Encounter (Signed)
 Sent. Thanks.

## 2023-09-01 NOTE — Telephone Encounter (Signed)
 See other phone note

## 2023-10-04 ENCOUNTER — Other Ambulatory Visit: Payer: Self-pay | Admitting: Family Medicine

## 2023-10-05 ENCOUNTER — Other Ambulatory Visit: Payer: Self-pay | Admitting: Family Medicine

## 2023-10-05 NOTE — Telephone Encounter (Signed)
 Last filled 08-26-23 #40 Last OV 03-25-23 No Future OV  [facility]

## 2023-10-06 ENCOUNTER — Encounter: Payer: Self-pay | Admitting: Family Medicine

## 2023-10-06 NOTE — Telephone Encounter (Signed)
 Sent. Thanks.

## 2023-10-07 NOTE — Telephone Encounter (Signed)
 LOV: 03/25/23 NOV: NOTHING SCHEDULED LAST REFILL: predniSONE  (DELTASONE ) 10 MG tablet 06/30/23 9 TABLETS 0 REFILLS

## 2023-10-08 NOTE — Telephone Encounter (Signed)
Sent but please get update on patient.  Thanks. 

## 2023-10-13 ENCOUNTER — Ambulatory Visit: Payer: Self-pay

## 2023-10-13 NOTE — Telephone Encounter (Signed)
 FYI   Spoke with pt, he declined to be triaged and states he went online and scheduled an appt.    Reason for Disposition  [1] Caller requesting NON-URGENT health information AND [2] PCP's office is the best resource  Protocols used: Information Only Call - No Triage-A-AH

## 2023-10-13 NOTE — Telephone Encounter (Signed)
 Attempted to contact pt, no answer and unable to leave message.

## 2023-10-13 NOTE — Telephone Encounter (Signed)
 Copied from CRM [id number]. Topic: Clinical - Red Word Triage >> Oct 13, 2023 11:13 AM Magdalene School wrote: Red Word that prompted transfer to Nurse Triage: Back pain.

## 2023-10-13 NOTE — Telephone Encounter (Signed)
 Noted. Thanks.

## 2023-10-19 ENCOUNTER — Ambulatory Visit (INDEPENDENT_AMBULATORY_CARE_PROVIDER_SITE_OTHER): Admitting: Family Medicine

## 2023-10-19 ENCOUNTER — Ambulatory Visit (INDEPENDENT_AMBULATORY_CARE_PROVIDER_SITE_OTHER)
Admission: RE | Admit: 2023-10-19 | Discharge: 2023-10-19 | Disposition: A | Discharge status: Home / Self Care | Source: Ambulatory Visit | Attending: Family Medicine | Admitting: Family Medicine

## 2023-10-19 ENCOUNTER — Encounter: Payer: Self-pay | Admitting: Family Medicine

## 2023-10-19 VITALS — BP 132/78 | HR 83 | Temp 98.6°F | Ht 69.0 in | Wt 256.8 lb

## 2023-10-19 DIAGNOSIS — M25551 Pain in right hip: Secondary | ICD-10-CM | POA: Diagnosis not present

## 2023-10-19 DIAGNOSIS — M4317 Spondylolisthesis, lumbosacral region: Secondary | ICD-10-CM | POA: Diagnosis not present

## 2023-10-19 DIAGNOSIS — M549 Dorsalgia, unspecified: Secondary | ICD-10-CM

## 2023-10-19 DIAGNOSIS — M25559 Pain in unspecified hip: Secondary | ICD-10-CM

## 2023-10-19 DIAGNOSIS — M1611 Unilateral primary osteoarthritis, right hip: Secondary | ICD-10-CM | POA: Diagnosis not present

## 2023-10-19 DIAGNOSIS — M47816 Spondylosis without myelopathy or radiculopathy, lumbar region: Secondary | ICD-10-CM | POA: Diagnosis not present

## 2023-10-19 MED ORDER — PREDNISONE 20 MG PO TABS: ORAL_TABLET | ORAL | 0 refills | Status: DC

## 2023-10-19 NOTE — Patient Instructions (Signed)
 Prednisone  with food.  Soma  if needed.  Xrays on the way out.  Refer to PT.  Take care.  Glad to see you.
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
## 2023-10-20 ENCOUNTER — Telehealth: Payer: Self-pay | Admitting: Family Medicine

## 2023-10-20 DIAGNOSIS — M549 Dorsalgia, unspecified: Secondary | ICD-10-CM | POA: Insufficient documentation

## 2023-10-20 NOTE — Assessment & Plan Note (Signed)
 Back pain, unclear how much of his symptoms are related to primary degenerative changes in the back versus degenerative changes in the hip.  Discussed getting plain films of both.  See notes on imaging. Prednisone  with food.  Steroid cautions discussed with patient. Soma  if needed.  Sedation caution discussed. Refer to PT. he agrees with plan.

## 2023-10-20 NOTE — Telephone Encounter (Signed)
 Please update patient.  I am awaiting the overread on his films.  I looked at the images in the meantime.  He has some degenerative changes, it looks to be more in the back than in the hip.  I do not see a fracture.  If he does not improve with PT and prednisone  then let me know.  I would still continue as planned.  We will update him when the overread is available.  Thanks.

## 2023-10-21 NOTE — Telephone Encounter (Signed)
 Patient notified

## 2023-10-24 ENCOUNTER — Ambulatory Visit: Payer: Self-pay | Admitting: Family Medicine

## 2023-10-25 NOTE — Therapy (Deleted)
 OUTPATIENT PHYSICAL THERAPY THORACOLUMBAR EVALUATION   Patient Name: Juan Thompson MRN: [id number] DOB:01-10-1956, 68 y.o., male Today's Date: 10/25/2023  END OF SESSION:   Past Medical History:  Diagnosis Date   Arthritis    L knee, occ vicodin use   ED (erectile dysfunction)    History of MRI of cervical spine 04/17/05   without DDD 4/5, bulg 5/6, narrowing 5/6   Hypercholesteremia    mild   Hypertension    MRSA bacteremia    lower extremity- age 80   Neck pain    with soma  use for spasm, he sleeps with round pillow under neck for comfort   Sleep apnea 04/17/05   mild, couldn't tolerate CPAP.    Past Surgical History:  Procedure Laterality Date   therapeutic nasal fracture  1976   TONSILLECTOMY      As a child   WISDOM TOOTH EXTRACTION     Patient Active Problem List   Diagnosis Date Noted   Back pain 10/20/2023   Arthralgia of right knee 05/19/2023   Arthralgia of left knee 05/19/2023   Pain in wrist 01/11/2023   Pain of hand 05/24/2022   HLD (hyperlipidemia) 06/11/2019   Nasal congestion 06/11/2019   Personal history of tobacco use, presenting hazards to health 06/20/2018   Advance care planning 01/21/2017   Medicare annual wellness visit, initial 08/27/2011   Knee pain 08/27/2011   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  ONSET DATE: ***  SUBJECTIVE:                                                                                                                                                                                           SUBJECTIVE STATEMENT: ***  PERTINENT HISTORY:  ***  PAIN:  Are you having pain? {OPRCPAIN:27236}  PRECAUTIONS: {Therapy  precautions:24002}  RED FLAGS: {PT Red Flags:29287}   WEIGHT BEARING RESTRICTIONS: {Yes ***/No:24003}  FALLS:  Has patient fallen in last 6 months? {fallsyesno:27318}  LIVING ENVIRONMENT: Lives with: {OPRC lives with:25569::lives with their family} Lives in: {Lives in:25570} Stairs: {opstairs:27293} Has following equipment at home: {Assistive devices:23999}  OCCUPATION: ***  PLOF: {PLOF:24004}  PATIENT GOALS: ***  NEXT MD VISIT: ***  OBJECTIVE:  Note: Objective measures were completed at Evaluation unless otherwise noted.  DIAGNOSTIC FINDINGS:  ***  PATIENT SURVEYS:  {rehab surveys:24030}  COGNITION: Overall cognitive status: {cognition:24006}  SENSATION: {sensation:27233}  MUSCLE LENGTH: Hamstrings: Right *** deg; Left *** deg Andy Bannister test: Right *** deg; Left *** deg  POSTURE: {posture:25561}  PALPATION: ***  LUMBAR ROM:   AROM eval  Flexion   Extension   Right lateral flexion   Left lateral flexion   Right rotation   Left rotation    (Blank rows = not tested)  LOWER EXTREMITY ROM:     {AROM/PROM:27142}  Right eval Left eval  Hip flexion    Hip extension    Hip abduction    Hip adduction    Hip internal rotation    Hip external rotation    Knee flexion    Knee extension    Ankle dorsiflexion    Ankle plantarflexion    Ankle inversion    Ankle eversion     (Blank rows = not tested)  LOWER EXTREMITY MMT:    MMT Right eval Left eval  Hip flexion    Hip extension    Hip abduction    Hip adduction    Hip internal rotation    Hip external rotation    Knee flexion    Knee extension    Ankle dorsiflexion    Ankle plantarflexion    Ankle inversion    Ankle eversion     (Blank rows = not tested)  LUMBAR SPECIAL TESTS:  {lumbar special test:25242}  FUNCTIONAL TESTS:  {Functional tests:24029}  GAIT: Distance walked: *** Assistive device utilized: {Assistive devices:23999} Level of assistance: {Levels of  assistance:24026} Comments: ***  TREATMENT DATE: ***                                                                                                                                 PATIENT EDUCATION:  Education details: *** Person educated: {Person educated:25204} Education method: {Education Method:25205} Education comprehension: {Education Comprehension:25206}  HOME EXERCISE PROGRAM: ***  ASSESSMENT:  CLINICAL IMPRESSION: Patient is a *** y.o. *** who was seen today for physical therapy evaluation and treatment for ***.   OBJECTIVE IMPAIRMENTS: {opptimpairments:25111}.   ACTIVITY LIMITATIONS: {activitylimitations:27494}  PARTICIPATION LIMITATIONS: {participationrestrictions:25113}  PERSONAL FACTORS: {Personal factors:25162} are also affecting patient's functional outcome.   REHAB POTENTIAL: {rehabpotential:25112}  CLINICAL DECISION MAKING: {clinical decision making:25114}  EVALUATION COMPLEXITY: {Evaluation complexity:25115}   GOALS: Goals reviewed with patient? {yes/no:20286}  SHORT TERM GOALS: Target date: ***  *** Baseline: Goal status: INITIAL  2.  *** Baseline:  Goal status: INITIAL  3.  *** Baseline:  Goal status: INITIAL  4.  *** Baseline:  Goal status: INITIAL  5.  *** Baseline:  Goal status: INITIAL  6.  *** Baseline:  Goal status: INITIAL  LONG TERM GOALS: Target date: ***  *** Baseline:  Goal status: INITIAL  2.  *** Baseline:  Goal status: INITIAL  3.  *** Baseline:  Goal status: INITIAL  4.  *** Baseline:  Goal status: INITIAL  5.  *** Baseline:  Goal status: INITIAL  6.  ***  Baseline:  Goal status: INITIAL  PLAN:  PT FREQUENCY: {rehab frequency:25116}  PT DURATION: {rehab duration:25117}  PLANNED INTERVENTIONS: {rehab planned interventions:25118::97110-Therapeutic exercises,97530- Therapeutic [phone]- Neuromuscular re-education,97535- Self ONGE,95284- Manual therapy}.  PLAN FOR NEXT  SESSION: ***   Breck Calix, PT 10/25/2023, 6:32 PM

## 2023-10-27 ENCOUNTER — Ambulatory Visit: Admitting: Physical Therapy

## 2023-11-01 ENCOUNTER — Ambulatory Visit: Admitting: Physical Therapy

## 2023-11-03 ENCOUNTER — Ambulatory Visit: Admitting: Physical Therapy

## 2023-11-08 ENCOUNTER — Encounter: Admitting: Physical Therapy

## 2023-11-08 ENCOUNTER — Ambulatory Visit: Admitting: Physical Therapy

## 2023-11-14 ENCOUNTER — Other Ambulatory Visit: Payer: Self-pay | Admitting: Family Medicine

## 2023-11-14 DIAGNOSIS — M25559 Pain in unspecified hip: Secondary | ICD-10-CM

## 2023-11-16 DIAGNOSIS — M47816 Spondylosis without myelopathy or radiculopathy, lumbar region: Secondary | ICD-10-CM | POA: Diagnosis not present

## 2023-11-18 DIAGNOSIS — M47816 Spondylosis without myelopathy or radiculopathy, lumbar region: Secondary | ICD-10-CM | POA: Diagnosis not present

## 2023-11-22 ENCOUNTER — Encounter: Admitting: Physical Therapy

## 2023-11-25 ENCOUNTER — Encounter: Admitting: Physical Therapy

## 2023-11-30 ENCOUNTER — Encounter: Admitting: Physical Therapy

## 2023-11-30 ENCOUNTER — Ambulatory Visit: Payer: Self-pay

## 2023-11-30 NOTE — Telephone Encounter (Signed)
 Copied from CRM [id number]. Topic: Clinical - Medical Advice >> Nov 30, 2023  4:29 PM Drema MATSU wrote: Reason for CRM: Patient has a rash that has started on the palm of his hands and is spreading all over. He wants to know if some steroid or anything he can take until his appointment.

## 2023-11-30 NOTE — Telephone Encounter (Addendum)
 Offered VUC and inperson appt-declined, would like rx.   FYI Only or Action Required?: Action required by provider: clinical question for provider.  Patient was last seen in primary care on 10/19/2023 by Cleatus Arlyss RAMAN, MD.  Called Nurse Triage reporting Rash.  Symptoms began several days ago.  Interventions attempted: OTC medications: cortisone cream.  Symptoms are: gradually worsening.  Triage Disposition: See PCP When Office is Open (Within 3 Days)  Patient/caregiver understands and will follow disposition?: No, wishes to speak with PCP  Reason for Disposition  Mild widespread rash  (Exception: Heat rash lasting 3 days or less.)  Answer Assessment - Initial Assessment Questions 1. CALLER DIAGNOSIS: What do you think is causing the rash? (e.g., athlete's foot, chickenpox, hives, impetigo)     Potentially poison oak/ivy, states was gardening 2. LOCALIZED OR WIDESPREAD:  Is the rash all over (widespread) or mostly just in one area of the body (localized)?      widespread 3. NEW MEDICINES: Are you taking any new medicine?     denies 4. APPEARANCE of RASH: What does the rash look like? What color is it?  Note: It is difficult to assess rash color in people with darker-colored skin. When this situation occurs, simply ask the caller to describe what they see.     Redness, bumpy mild,  5. FEVER: Do you have a fever? If Yes, ask: What is your temperature, how was it measured, and when did it start?     denies  Used Cortizone10 cream seemed to help per pt. Pt denies SOB, difficulty breathing, scratchy throat. States that worse with heat. Wife has not developed the rash at this time. Would like rx.  Protocols used: Rash - Guideline Selection-A-AH, Rash or Redness - [facility]

## 2023-12-01 ENCOUNTER — Telehealth: Payer: Self-pay

## 2023-12-01 NOTE — Telephone Encounter (Signed)
 Copied from CRM [id number]. Topic: Clinical - Medical Advice >> Nov 30, 2023  4:29 PM Drema MATSU wrote: Reason for CRM: Patient has a rash that has started on the palm of his hands and is spreading all over. He wants to know if some steroid or anything he can take until his appointment.

## 2023-12-01 NOTE — Telephone Encounter (Signed)
 Reached out to advise that Dr. Cleatus is not in the office but he would need to be seen in the office. I did advise nothing in office available today so he may want to followup with urgent care. He said that he took some Benadryl and some other medication he had for allergies and that seems to be helping. He will call back if it does not work and make an appointment. Closing encounter

## 2023-12-01 NOTE — Telephone Encounter (Signed)
 Closing encounter. Returned call has been made to patient in prior encounter

## 2023-12-02 ENCOUNTER — Other Ambulatory Visit: Payer: Self-pay | Admitting: Family Medicine

## 2023-12-02 ENCOUNTER — Encounter: Admitting: Physical Therapy

## 2023-12-08 ENCOUNTER — Encounter: Admitting: Physical Therapy

## 2023-12-09 ENCOUNTER — Ambulatory Visit: Admitting: Family Medicine

## 2023-12-31 ENCOUNTER — Other Ambulatory Visit: Payer: Self-pay | Admitting: Family Medicine

## 2023-12-31 NOTE — Telephone Encounter (Signed)
 LOV: 10/19/23 WNC:wnuypwh scheduled  Last Refill: carisoprodol  (SOMA ) 350 MG tablet 09/01/2023 30 tablets 0 refills

## 2024-01-02 NOTE — Telephone Encounter (Signed)
 Sent. Thanks.

## 2024-01-28 ENCOUNTER — Other Ambulatory Visit: Payer: Self-pay | Admitting: Family Medicine

## 2024-01-28 DIAGNOSIS — M549 Dorsalgia, unspecified: Secondary | ICD-10-CM

## 2024-01-28 NOTE — Telephone Encounter (Signed)
 Ibuprofen  Last filled:  01/01/24, #90 Last OV:  10/19/23, hip/back pain Next OV:  none

## 2024-02-02 ENCOUNTER — Other Ambulatory Visit: Payer: Self-pay | Admitting: Family Medicine

## 2024-02-04 NOTE — Telephone Encounter (Signed)
 LOV: 10/19/23 NOV: nothing scheduled  Last Refill: predniSONE  (DELTASONE ) 20 MG tablet  10/19/2523 15 tablets 0 refills

## 2024-02-29 ENCOUNTER — Other Ambulatory Visit: Payer: Self-pay | Admitting: Family Medicine

## 2024-02-29 DIAGNOSIS — I10 Essential (primary) hypertension: Secondary | ICD-10-CM

## 2024-03-07 ENCOUNTER — Encounter: Payer: Self-pay | Admitting: Family Medicine

## 2024-03-07 ENCOUNTER — Ambulatory Visit (INDEPENDENT_AMBULATORY_CARE_PROVIDER_SITE_OTHER): Admitting: Family Medicine

## 2024-03-07 VITALS — BP 138/84 | HR 62 | Temp 98.1°F | Ht 69.0 in | Wt 264.0 lb

## 2024-03-07 DIAGNOSIS — I10 Essential (primary) hypertension: Secondary | ICD-10-CM | POA: Diagnosis not present

## 2024-03-07 DIAGNOSIS — M79643 Pain in unspecified hand: Secondary | ICD-10-CM

## 2024-03-07 DIAGNOSIS — Z8739 Personal history of other diseases of the musculoskeletal system and connective tissue: Secondary | ICD-10-CM

## 2024-03-07 MED ORDER — LISINOPRIL 20 MG PO TABS: 10.0000 mg | ORAL_TABLET | Freq: Two times a day (BID) | ORAL | Status: DC

## 2024-03-07 MED ORDER — PREDNISONE 20 MG PO TABS: ORAL_TABLET | ORAL | 0 refills | Status: DC

## 2024-03-07 NOTE — Patient Instructions (Signed)
 Change lisinopril  to 10mg  twice a day.  Update me about your BP in about 1 week.  Take care.  Glad to see you.

## 2024-03-07 NOTE — Progress Notes (Addendum)
 Hypertension:    Using medication without problems or lightheadedness: yes Chest pain with exertion:no Edema:no Short of breath:no BP usually lower in the AMs but higher in the PMs.    His right hand improved with short course of prednisone .  He didn't need the full rx.  Then had back pain that responded to prednisone , didn't need to finish the rx.   D/w pt about having rx on hand in case of need.  He can update me as needed.  Still taking tramadol  for knee pain at baseline.  Nsaid cautions d/w pt, not to use with prednisone  and to take with food.    Meds, vitals, and allergies reviewed.   PMH and SH reviewed  ROS: Per HPI unless specifically indicated in ROS section   GEN: nad, alert and oriented HEENT: ncat NECK: supple w/o LA CV: rrr. PULM: ctab, no inc wob ABD: soft, +bs EXT: no edema SKIN: Well-perfused.

## 2024-03-08 NOTE — Assessment & Plan Note (Signed)
 Discussed options.  Blood pressure has been controlled in the mornings. Change lisinopril  to 10mg  twice a day.  That would still be 20 mg with no change in total daily dose. I asked him to update me about his BP in about 1 week.  We may need to adjust his total daily dose after that.

## 2024-03-08 NOTE — Assessment & Plan Note (Addendum)
 Prednisone  prescription rx'd with routine instructions regarding prednisone /NSAIDs.

## 2024-03-13 ENCOUNTER — Encounter: Payer: Self-pay | Admitting: Radiology

## 2024-03-17 ENCOUNTER — Encounter: Payer: Self-pay | Admitting: Family Medicine

## 2024-03-24 ENCOUNTER — Other Ambulatory Visit: Payer: Self-pay | Admitting: Family Medicine

## 2024-03-24 NOTE — Telephone Encounter (Signed)
 Refilled: 01/02/2024 Last OV: 03/07/2024 Next OV: not scheduled

## 2024-04-07 ENCOUNTER — Other Ambulatory Visit: Payer: Self-pay | Admitting: Family Medicine

## 2024-04-12 NOTE — Telephone Encounter (Signed)
 Name of Medication: Tramadol  50mg  Name of Pharmacy: [facility] [address] Last Kandra or Written Date and Quantity: 10/06/23 Last Office Visit and Type: 03/07/24 Next Office Visit and Type: no appt made Last Controlled Substance Agreement Date:  Last UDS:

## 2024-05-26 ENCOUNTER — Other Ambulatory Visit: Payer: Self-pay | Admitting: Family Medicine

## 2024-05-26 DIAGNOSIS — Z8739 Personal history of other diseases of the musculoskeletal system and connective tissue: Secondary | ICD-10-CM

## 2024-05-29 NOTE — Telephone Encounter (Signed)
 Prednisone  Last filled:  03/07/24, #15 Last OV:  03/07/24, joint pain Next OV:  none

## 2024-05-31 NOTE — Telephone Encounter (Signed)
 Sent. Thanks.

## 2024-06-04 ENCOUNTER — Other Ambulatory Visit: Payer: Self-pay | Admitting: Family Medicine

## 2024-06-04 DIAGNOSIS — I10 Essential (primary) hypertension: Secondary | ICD-10-CM

## 2024-06-05 NOTE — Telephone Encounter (Signed)
 Per 03/07/24 OV notes, pt was to take 0.5 BID of 20 mg tab. Directions with current request are: Take 1 tab by mouth every day. Pls claify with pt.  Lvm asking pt to call back. Need to know is pt taking 1/2 of 20 mg tab twice a day or taking 1 whole tab once a day.
